# Patient Record
Sex: Female | Born: 1970 | Race: White | Hispanic: No | Marital: Single | Smoking: Former smoker
Health system: Southern US, Community
[De-identification: ages and names within clinical notes are randomized; demographics above are authoritative.]

## PROBLEM LIST (undated history)

## (undated) DIAGNOSIS — J45909 Unspecified asthma, uncomplicated: Secondary | ICD-10-CM

## (undated) DIAGNOSIS — T4145XA Adverse effect of unspecified anesthetic, initial encounter: Secondary | ICD-10-CM

## (undated) DIAGNOSIS — T8859XA Other complications of anesthesia, initial encounter: Secondary | ICD-10-CM

## (undated) DIAGNOSIS — J302 Other seasonal allergic rhinitis: Secondary | ICD-10-CM

## (undated) DIAGNOSIS — W57XXXA Bitten or stung by nonvenomous insect and other nonvenomous arthropods, initial encounter: Secondary | ICD-10-CM

## (undated) DIAGNOSIS — E063 Autoimmune thyroiditis: Secondary | ICD-10-CM

## (undated) DIAGNOSIS — E039 Hypothyroidism, unspecified: Secondary | ICD-10-CM

## (undated) HISTORY — DX: Unspecified asthma, uncomplicated: J45.909

## (undated) HISTORY — DX: Autoimmune thyroiditis: E06.3

## (undated) HISTORY — PX: CHOLECYSTECTOMY: SHX55

## (undated) HISTORY — DX: Hypothyroidism, unspecified: E03.9

## (undated) HISTORY — PX: TONSILLECTOMY: SUR1361

## (undated) HISTORY — DX: Bitten or stung by nonvenomous insect and other nonvenomous arthropods, initial encounter: W57.XXXA

---

## 2012-08-08 ENCOUNTER — Other Ambulatory Visit: Payer: Self-pay | Admitting: Obstetrics & Gynecology

## 2012-08-08 DIAGNOSIS — Z139 Encounter for screening, unspecified: Secondary | ICD-10-CM

## 2012-08-18 ENCOUNTER — Other Ambulatory Visit (HOSPITAL_COMMUNITY)
Admission: RE | Admit: 2012-08-18 | Discharge: 2012-08-18 | Disposition: A | Discharge status: Home / Self Care | Payer: BC Managed Care – PPO | Source: Ambulatory Visit | Attending: Obstetrics & Gynecology | Admitting: Obstetrics & Gynecology

## 2012-08-18 ENCOUNTER — Other Ambulatory Visit: Payer: Self-pay | Admitting: Obstetrics & Gynecology

## 2012-08-18 ENCOUNTER — Ambulatory Visit (HOSPITAL_COMMUNITY)
Admission: RE | Admit: 2012-08-18 | Discharge: 2012-08-18 | Disposition: A | Discharge status: Home / Self Care | Payer: BC Managed Care – PPO | Source: Ambulatory Visit | Attending: Obstetrics & Gynecology | Admitting: Obstetrics & Gynecology

## 2012-08-18 DIAGNOSIS — Z1231 Encounter for screening mammogram for malignant neoplasm of breast: Secondary | ICD-10-CM | POA: Insufficient documentation

## 2012-08-18 DIAGNOSIS — Z01419 Encounter for gynecological examination (general) (routine) without abnormal findings: Secondary | ICD-10-CM | POA: Insufficient documentation

## 2012-08-18 DIAGNOSIS — Z1151 Encounter for screening for human papillomavirus (HPV): Secondary | ICD-10-CM | POA: Insufficient documentation

## 2012-08-18 DIAGNOSIS — Z139 Encounter for screening, unspecified: Secondary | ICD-10-CM

## 2012-08-23 ENCOUNTER — Other Ambulatory Visit: Payer: Self-pay | Admitting: Obstetrics & Gynecology

## 2012-08-23 DIAGNOSIS — R928 Other abnormal and inconclusive findings on diagnostic imaging of breast: Secondary | ICD-10-CM

## 2012-09-14 ENCOUNTER — Ambulatory Visit (HOSPITAL_COMMUNITY)
Admission: RE | Admit: 2012-09-14 | Discharge: 2012-09-14 | Disposition: A | Discharge status: Home / Self Care | Payer: BC Managed Care – PPO | Source: Ambulatory Visit | Attending: Obstetrics & Gynecology | Admitting: Obstetrics & Gynecology

## 2012-09-14 ENCOUNTER — Other Ambulatory Visit: Payer: Self-pay | Admitting: Obstetrics & Gynecology

## 2012-09-14 DIAGNOSIS — R928 Other abnormal and inconclusive findings on diagnostic imaging of breast: Secondary | ICD-10-CM

## 2013-08-16 ENCOUNTER — Other Ambulatory Visit: Payer: Self-pay | Admitting: Obstetrics & Gynecology

## 2013-08-16 DIAGNOSIS — Z139 Encounter for screening, unspecified: Secondary | ICD-10-CM

## 2013-08-23 ENCOUNTER — Other Ambulatory Visit: Payer: Self-pay | Admitting: Obstetrics & Gynecology

## 2013-09-15 ENCOUNTER — Ambulatory Visit (HOSPITAL_COMMUNITY)
Admission: RE | Admit: 2013-09-15 | Discharge: 2013-09-15 | Disposition: A | Discharge status: Home / Self Care | Payer: BC Managed Care – PPO | Source: Ambulatory Visit | Attending: Obstetrics & Gynecology | Admitting: Obstetrics & Gynecology

## 2013-09-15 ENCOUNTER — Other Ambulatory Visit (HOSPITAL_COMMUNITY)
Admission: RE | Admit: 2013-09-15 | Discharge: 2013-09-15 | Disposition: A | Discharge status: Home / Self Care | Payer: BC Managed Care – PPO | Source: Ambulatory Visit | Attending: Obstetrics & Gynecology | Admitting: Obstetrics & Gynecology

## 2013-09-15 ENCOUNTER — Encounter (INDEPENDENT_AMBULATORY_CARE_PROVIDER_SITE_OTHER): Payer: Self-pay

## 2013-09-15 ENCOUNTER — Ambulatory Visit (INDEPENDENT_AMBULATORY_CARE_PROVIDER_SITE_OTHER): Payer: BC Managed Care – PPO | Admitting: Obstetrics & Gynecology

## 2013-09-15 ENCOUNTER — Encounter: Payer: Self-pay | Admitting: Obstetrics & Gynecology

## 2013-09-15 VITALS — BP 110/80 | Ht 64.0 in | Wt 197.0 lb

## 2013-09-15 DIAGNOSIS — Z1231 Encounter for screening mammogram for malignant neoplasm of breast: Secondary | ICD-10-CM | POA: Insufficient documentation

## 2013-09-15 DIAGNOSIS — Z01419 Encounter for gynecological examination (general) (routine) without abnormal findings: Secondary | ICD-10-CM

## 2013-09-15 DIAGNOSIS — Z139 Encounter for screening, unspecified: Secondary | ICD-10-CM

## 2013-09-15 NOTE — Progress Notes (Signed)
Patient ID: Kimberly Vance, female   DOB: [birth year], 42 y.o.   MRN: [id number] Subjective:     Kimberly Vance is a 42 y.o. female here for a routine exam.  No LMP recorded. Patient is not currently having periods (Reason: Perimenopausal). No obstetric history on file. Birth Control Method:  none Menstrual Calendar(currently): none in 1 year  Current complaints: none.   Current acute medical issues:  none   Recent Gynecologic History No LMP recorded. Patient is not currently having periods (Reason: Perimenopausal). Last Pap: 2013,  normal Last mammogram: today,  pending  History reviewed. No pertinent past medical history.  History reviewed. No pertinent past surgical history.  OB History   Grav Para Term Preterm Abortions TAB SAB Ect Mult Living                  History   Social History  . Marital Status: Single    Spouse Name: N/A    Number of Children: N/A  . Years of Education: N/A   Social History Main Topics  . Smoking status: Current Some Day Smoker  . Smokeless tobacco: None  . Alcohol Use: None  . Drug Use: None  . Sexual Activity: None   Other Topics Concern  . None   Social History Narrative  . None    Family History  Problem Relation Age of Onset  . Diabetes Mother   . Diabetes Father   . Diabetes Maternal Grandmother   . Diabetes Paternal Grandmother      Review of Systems  Review of Systems  Constitutional: Negative for fever, chills, weight loss, malaise/fatigue and diaphoresis.  HENT: Negative for hearing loss, ear pain, nosebleeds, congestion, sore throat, neck pain, tinnitus and ear discharge.   Eyes: Negative for blurred vision, double vision, photophobia, pain, discharge and redness.  Respiratory: Negative for cough, hemoptysis, sputum production, shortness of breath, wheezing and stridor.   Cardiovascular: Negative for chest pain, palpitations, orthopnea, claudication, leg swelling and PND.  Gastrointestinal: negative for abdominal pain.  Negative for heartburn, nausea, vomiting, diarrhea, constipation, blood in stool and melena.  Genitourinary: Negative for dysuria, urgency, frequency, hematuria and flank pain.  Musculoskeletal: Negative for myalgias, back pain, joint pain and falls.  Skin: Negative for itching and rash.  Neurological: Negative for dizziness, tingling, tremors, sensory change, speech change, focal weakness, seizures, loss of consciousness, weakness and headaches.  Endo/Heme/Allergies: Negative for environmental allergies and polydipsia. Does not bruise/bleed easily.  Psychiatric/Behavioral: Negative for depression, suicidal ideas, hallucinations, memory loss and substance abuse. The patient is not nervous/anxious and does not have insomnia.        Objective:    Physical Exam  Vitals reviewed. Constitutional: She is oriented to person, place, and time. She appears well-developed and well-nourished.  HENT:  Head: Normocephalic and atraumatic.        Right Ear: External ear normal.  Left Ear: External ear normal.  Nose: Nose normal.  Mouth/Throat: Oropharynx is clear and moist.  Eyes: Conjunctivae and EOM are normal. Pupils are equal, round, and reactive to light. Right eye exhibits no discharge. Left eye exhibits no discharge. No scleral icterus.  Neck: Normal range of motion. Neck supple. No tracheal deviation present. No thyromegaly present.  Cardiovascular: Normal rate, regular rhythm, normal heart sounds and intact distal pulses.  Exam reveals no gallop and no friction rub.   No murmur heard. Respiratory: Effort normal and breath sounds normal. No respiratory distress. She has no wheezes. She has no rales. She exhibits no  tenderness.  GI: Soft. Bowel sounds are normal. She exhibits no distension and no mass. There is no tenderness. There is no rebound and no guarding.  Genitourinary:  Breasts no masses skin changes or nipple changes bilaterally      Vulva is normal without lesions Vagina is pink moist  without discharge Cervix normal in appearance and pap is done Uterus is normal size shape and contour Adnexa is negative with normal sized ovaries    Musculoskeletal: Normal range of motion. She exhibits no edema and no tenderness.  Neurological: She is alert and oriented to person, place, and time. She has normal reflexes. She displays normal reflexes. No cranial nerve deficit. She exhibits normal muscle tone. Coordination normal.  Skin: Skin is warm and dry. No rash noted. No erythema. No pallor.  Psychiatric: She has a normal mood and affect. Her behavior is normal. Judgment and thought content normal.       Assessment:    Healthy female exam.   Perimenopausal/post menopausal Plan:    Follow up in: 1 year.

## 2013-12-04 ENCOUNTER — Ambulatory Visit (INDEPENDENT_AMBULATORY_CARE_PROVIDER_SITE_OTHER): Payer: BC Managed Care – PPO | Admitting: Obstetrics & Gynecology

## 2013-12-04 ENCOUNTER — Encounter: Payer: Self-pay | Admitting: Obstetrics & Gynecology

## 2013-12-04 VITALS — BP 100/70 | Ht 65.0 in | Wt 206.0 lb

## 2013-12-04 DIAGNOSIS — N950 Postmenopausal bleeding: Secondary | ICD-10-CM

## 2013-12-04 LAB — POCT HEMOGLOBIN: Hemoglobin: 13.8 g/dL (ref 12.2–16.2)

## 2013-12-04 MED ORDER — MEGESTROL ACETATE 40 MG PO TABS: ORAL_TABLET | ORAL | Status: DC

## 2013-12-04 NOTE — Addendum Note (Signed)
Addended by: Criss AlvinePULLIAM, Rickie Gutierres G on: 12/04/2013 03:15 PM   Modules accepted: Orders

## 2013-12-04 NOTE — Progress Notes (Signed)
Patient ID: Kimberly BeardsShannon Vance, female   DOB: 09/15/1971, 43 y.o.   MRN: [id number] Pt presnets complaining of bleeding since yestaerday am, none in the previous 18 months or so Heavy some clots, no cramping  Exam No polyps or other findings, heavy bleeding  Uterus normal size shape contour adnexa negative  Megestrol to manage bleeding Sonogram 2 weeks to evaluate endometrium

## 2013-12-19 ENCOUNTER — Ambulatory Visit (INDEPENDENT_AMBULATORY_CARE_PROVIDER_SITE_OTHER): Payer: BC Managed Care – PPO

## 2013-12-19 ENCOUNTER — Encounter: Payer: Self-pay | Admitting: Obstetrics & Gynecology

## 2013-12-19 ENCOUNTER — Encounter (INDEPENDENT_AMBULATORY_CARE_PROVIDER_SITE_OTHER): Payer: Self-pay

## 2013-12-19 ENCOUNTER — Ambulatory Visit (INDEPENDENT_AMBULATORY_CARE_PROVIDER_SITE_OTHER): Payer: BC Managed Care – PPO | Admitting: Obstetrics & Gynecology

## 2013-12-19 VITALS — BP 110/80 | Wt 208.0 lb

## 2013-12-19 DIAGNOSIS — N951 Menopausal and female climacteric states: Secondary | ICD-10-CM

## 2013-12-19 DIAGNOSIS — N950 Postmenopausal bleeding: Secondary | ICD-10-CM

## 2013-12-19 MED ORDER — CONJ ESTROGENS-BAZEDOXIFENE 0.45-20 MG PO TABS: 1.0000 | ORAL_TABLET | Freq: Every day | ORAL | Status: DC

## 2013-12-19 NOTE — Progress Notes (Signed)
Patient ID: Kimberly Vance, female   DOB: 07/05/1971, 43 y.o.   MRN: [id number] [place] Transvaginal Non-ob  12/19/2013   GYNECOLOGIC SONOGRAM   Kimberly Vance is a 43 y.o. No obstetric history on file.  for a pelvic  sonogram for post menopausal bleeding.  Uterus                      7.2 x 4.9 x 3.2 cm, no myometrial masses noted    Endometrium          5.1 mm, symmetrical,   Right ovary             2.1 x 0.9 x 0.7 cm,   Left ovary                2.4 x 1.7 x 1.4 cm,   No free fluid or adnexal masses noted   Technician Comments:  Anteverted uterus Endom-5.441mm symmetrical bilateral adnexa/ovaries appears  WNL   Chari ManningMcBride, Tasha 12/19/2013 2:10 PM  Clinical Impression and recommendations:  I have reviewed the sonogram results above, combined with the patient's  current clinical course, below are my impressions and any appropriate  recommendations for management based on the sonographic findings.  Normal gynecologic sonogram without any pathology to explain bleeding  EURE,LUTHER H 12/19/2013 2:43 PM     Sonogram normal  Options discussed  I have recommended Duavee for ERT + endometrial suppression and rediscuss at around age 43  Samples and Rx e prescribed  History reviewed. No pertinent past medical history.  Past Surgical History  Procedure Laterality Date  . Cholecystectomy    . Tonsillectomy      OB History   Grav Para Term Preterm Abortions TAB SAB Ect Mult Living                  Allergies  Allergen Reactions  . Erythromycin Itching    History   Social History  . Marital Status: Single    Spouse Name: N/A    Number of Children: N/A  . Years of Education: N/A   Social History Main Topics  . Smoking status: Former Games developermoker  . Smokeless tobacco: None  . Alcohol Use: None  . Drug Use: None  . Sexual Activity: None   Other Topics Concern  . None   Social History Narrative  . None    Family History  Problem Relation Age of Onset  . Diabetes Mother   . Hypertension Mother   .  Diabetes Father   . Hypertension Father   . Diabetes Maternal Grandmother   . Diabetes Paternal Grandmother

## 2014-08-31 ENCOUNTER — Other Ambulatory Visit: Payer: Self-pay | Admitting: Obstetrics & Gynecology

## 2014-08-31 DIAGNOSIS — Z1231 Encounter for screening mammogram for malignant neoplasm of breast: Secondary | ICD-10-CM

## 2014-09-18 ENCOUNTER — Other Ambulatory Visit: Payer: BC Managed Care – PPO | Admitting: Obstetrics & Gynecology

## 2014-09-19 ENCOUNTER — Other Ambulatory Visit: Payer: BC Managed Care – PPO | Admitting: Obstetrics & Gynecology

## 2014-09-19 ENCOUNTER — Ambulatory Visit (HOSPITAL_COMMUNITY): Payer: BC Managed Care – PPO

## 2014-09-24 ENCOUNTER — Ambulatory Visit (HOSPITAL_COMMUNITY): Payer: BC Managed Care – PPO

## 2014-10-01 ENCOUNTER — Other Ambulatory Visit: Payer: BC Managed Care – PPO | Admitting: Obstetrics & Gynecology

## 2014-10-01 ENCOUNTER — Ambulatory Visit (HOSPITAL_COMMUNITY)
Admission: RE | Admit: 2014-10-01 | Discharge: 2014-10-01 | Disposition: A | Discharge status: Home / Self Care | Payer: BC Managed Care – PPO | Source: Ambulatory Visit | Attending: Obstetrics & Gynecology | Admitting: Obstetrics & Gynecology

## 2014-10-01 DIAGNOSIS — Z1231 Encounter for screening mammogram for malignant neoplasm of breast: Secondary | ICD-10-CM | POA: Diagnosis present

## 2014-10-09 ENCOUNTER — Encounter: Payer: Self-pay | Admitting: Obstetrics & Gynecology

## 2014-10-09 ENCOUNTER — Ambulatory Visit (INDEPENDENT_AMBULATORY_CARE_PROVIDER_SITE_OTHER): Payer: BC Managed Care – PPO | Admitting: Obstetrics & Gynecology

## 2014-10-09 ENCOUNTER — Other Ambulatory Visit (HOSPITAL_COMMUNITY)
Admission: RE | Admit: 2014-10-09 | Discharge: 2014-10-09 | Disposition: A | Discharge status: Home / Self Care | Payer: BC Managed Care – PPO | Source: Ambulatory Visit | Attending: Obstetrics & Gynecology | Admitting: Obstetrics & Gynecology

## 2014-10-09 VITALS — BP 108/70 | Ht 66.0 in | Wt 204.0 lb

## 2014-10-09 DIAGNOSIS — E538 Deficiency of other specified B group vitamins: Secondary | ICD-10-CM

## 2014-10-09 DIAGNOSIS — E038 Other specified hypothyroidism: Secondary | ICD-10-CM

## 2014-10-09 DIAGNOSIS — Z01419 Encounter for gynecological examination (general) (routine) without abnormal findings: Secondary | ICD-10-CM | POA: Diagnosis present

## 2014-10-09 DIAGNOSIS — R7989 Other specified abnormal findings of blood chemistry: Secondary | ICD-10-CM

## 2014-10-09 NOTE — Progress Notes (Signed)
Patient ID: Kimberly Vance, female   DOB: 07/30/1971, 43 y.o.   MRN: [id number] Subjective:     Kimberly Vance is a 43 y.o. female here for a routine exam.  No LMP recorded. Patient is postmenopausal. No obstetric history on file. Birth Control Method:  Post menopausal Menstrual Calendar(currently): amenorrheic  Current complaints: unusual tinglin numbness in extrmeities, no significant visual disturbances.   Current acute medical issues:  none   Recent Gynecologic History No LMP recorded. Patient is postmenopausal. Last Pap: 2014,  normal Last mammogram: 2014,  normal  History reviewed. No pertinent past medical history.  Past Surgical History  Procedure Laterality Date  . Cholecystectomy    . Tonsillectomy      OB History    No data available      History   Social History  . Marital Status: Single    Spouse Name: N/A    Number of Children: N/A  . Years of Education: N/A   Social History Main Topics  . Smoking status: Former Games developermoker  . Smokeless tobacco: None  . Alcohol Use: None  . Drug Use: None  . Sexual Activity: None   Other Topics Concern  . None   Social History Narrative    Family History  Problem Relation Age of Onset  . Diabetes Mother   . Hypertension Mother   . Diabetes Father   . Hypertension Father   . Diabetes Maternal Grandmother   . Diabetes Paternal Grandmother     Current outpatient prescriptions: Conj Estrogens-Bazedoxifene (DUAVEE) 0.45-20 MG TABS, Take 1 tablet by mouth daily. (Patient not taking: Reported on 10/09/2014), Disp: 30 tablet, Rfl: 11;  megestrol (MEGACE) 40 MG tablet, 3 tablets a day for 5 days 2 tablets a day for 5 days then 1 tablet a day (Patient not taking: Reported on 10/09/2014), Disp: 45 tablet, Rfl: 1  Review of Systems  Review of Systems  Constitutional: Negative for fever, chills, weight loss, malaise/fatigue and diaphoresis.  HENT: Negative for hearing loss, ear pain, nosebleeds, congestion, sore throat, neck  pain, tinnitus and ear discharge.   Eyes: Negative for blurred vision, double vision, photophobia, pain, discharge and redness.  Respiratory: Negative for cough, hemoptysis, sputum production, shortness of breath, wheezing and stridor.   Cardiovascular: Negative for chest pain, palpitations, orthopnea, claudication, leg swelling and PND.  Gastrointestinal: negative for abdominal pain. Negative for heartburn, nausea, vomiting, diarrhea, constipation, blood in stool and melena.  Genitourinary: Negative for dysuria, urgency, frequency, hematuria and flank pain.  Musculoskeletal: Negative for myalgias, back pain, joint pain and falls.  Skin: Negative for itching and rash.  Neurological: Negative for dizziness, tingling, tremors, sensory change, speech change, focal weakness, seizures, loss of consciousness, weakness and headaches.  Endo/Heme/Allergies: Negative for environmental allergies and polydipsia. Does not bruise/bleed easily.  Psychiatric/Behavioral: Negative for depression, suicidal ideas, hallucinations, memory loss and substance abuse. The patient is not nervous/anxious and does not have insomnia.        Objective:  Blood pressure 108/70, height 5\' 6"  (1.676 m), weight 204 lb (92.534 kg).   Physical Exam  Vitals reviewed. Constitutional: She is oriented to person, place, and time. She appears well-developed and well-nourished.  HENT:  Head: Normocephalic and atraumatic.        Right Ear: External ear normal.  Left Ear: External ear normal.  Nose: Nose normal.  Mouth/Throat: Oropharynx is clear and moist.  Eyes: Conjunctivae and EOM are normal. Pupils are equal, round, and reactive to light. Right eye exhibits no discharge. Left  eye exhibits no discharge. No scleral icterus.  Neck: Normal range of motion. Neck supple. No tracheal deviation present. No thyromegaly present.  Cardiovascular: Normal rate, regular rhythm, normal heart sounds and intact distal pulses.  Exam reveals no  gallop and no friction rub.   No murmur heard. Respiratory: Effort normal and breath sounds normal. No respiratory distress. She has no wheezes. She has no rales. She exhibits no tenderness.  GI: Soft. Bowel sounds are normal. She exhibits no distension and no mass. There is no tenderness. There is no rebound and no guarding.  Genitourinary:  Breasts no masses skin changes or nipple changes bilaterally      Vulva is normal without lesions Vagina is pink moist without discharge Cervix normal in appearance and pap is done Uterus is normal size shape and contour Adnexa is negative with normal sized ovaries   Musculoskeletal: Normal range of motion. She exhibits no edema and no tenderness.  Neurological: She is alert and oriented to person, place, and time. She has normal reflexes. She displays normal reflexes. No cranial nerve deficit. She exhibits normal muscle tone. Coordination normal.  Skin: Skin is warm and dry. No rash noted. No erythema. No pallor.  Psychiatric: She has a normal mood and affect. Her behavior is normal. Judgment and thought content normal.       Assessment:    Healthy female exam.   unusual neurological symptoms Plan:    Follow up in: 1 year. check TSH B12, refer to Dr Jerre [place] neurologist for evaluation

## 2014-10-09 NOTE — Addendum Note (Signed)
Addended by: Criss AlvinePULLIAM, Mellissa Conley G on: 10/09/2014 03:37 PM   Modules accepted: Orders

## 2014-10-10 ENCOUNTER — Telehealth: Payer: Self-pay | Admitting: *Deleted

## 2014-10-10 DIAGNOSIS — R200 Anesthesia of skin: Secondary | ICD-10-CM

## 2014-10-10 DIAGNOSIS — R202 Paresthesia of skin: Principal | ICD-10-CM

## 2014-10-10 LAB — VITAMIN B12: VITAMIN B 12: 320 pg/mL (ref 211–911)

## 2014-10-10 LAB — TSH: TSH: 2.885 u[IU]/mL (ref 0.350–4.500)

## 2014-10-10 NOTE — Telephone Encounter (Signed)
Spoke with pt. Pt states she wants to be referred to [facility], [phone]. I let pt know I would sent this to you. JSY

## 2014-10-11 LAB — CYTOLOGY - PAP

## 2014-10-11 NOTE — Telephone Encounter (Signed)
Please refer

## 2014-10-11 NOTE — Telephone Encounter (Signed)
Referral for [facility] done in Epic.

## 2014-11-22 ENCOUNTER — Encounter: Payer: Self-pay | Admitting: Neurology

## 2014-11-22 ENCOUNTER — Ambulatory Visit (INDEPENDENT_AMBULATORY_CARE_PROVIDER_SITE_OTHER): Payer: BLUE CROSS/BLUE SHIELD | Admitting: Neurology

## 2014-11-22 VITALS — BP 110/80 | HR 73 | Ht 66.0 in | Wt 210.2 lb

## 2014-11-22 DIAGNOSIS — G43109 Migraine with aura, not intractable, without status migrainosus: Secondary | ICD-10-CM

## 2014-11-22 DIAGNOSIS — R209 Unspecified disturbances of skin sensation: Secondary | ICD-10-CM

## 2014-11-22 DIAGNOSIS — R202 Paresthesia of skin: Secondary | ICD-10-CM

## 2014-11-22 DIAGNOSIS — M79605 Pain in left leg: Secondary | ICD-10-CM

## 2014-11-22 NOTE — Patient Instructions (Signed)
1.  Check blood work 2.  MRI brain wwo contrast 3.  EMG of the right arm and leg 4.  Return to clinic in 133-months

## 2014-11-22 NOTE — Progress Notes (Signed)
[facility] Clinic Note - Initial Visit   Date: 11/22/2014   Kimberly Vance MRN: [id number] DOB: [birth year]   Dear Dr. Elonda Husky:  Thank you for your kind referral of Kimberly Vance for consultation of disturbance of skin sensation. Although her history is well known to you, please allow [place] to reiterate it for the purpose of our medical record. The patient was accompanied to the clinic by self.   History of Present Illness: Kimberly Vance is a 44 y.o. right-handed Caucasian female with no prior medical history presenting for evaluation of numbness/tinging of the arms.    Starting early 2010, she developed intermittent spells of numbness of the entire arms which was initially sporadic and more recently has become more frequent and it takes longer for recovery. Symptoms wake her up from sleeping and she tried to reposition, but it does not help.  She reports having numbness from her elbow down into her hands and heavy sensation over the upper arm and occasionally had a spell where her tongue went numb.    She also complains of right leg pain which is triggered by pressure over her right lateral knee, such as when squatting or hitting it against something.  She feels that her feet are always numb.  No back or neck pain.   She endorses weakness but states that she has always been a "clutz" her entire life and always runs into things, falls, and drops things.  She never played sports growing up because she was always accident prone.  No family history of neuropathy.   She reports having migraines since her 43s.  Pain usually started in the front of her head and described as throbbing.  She endorses photophobia, phonophobia, and nausea.  She tries to sleep it off and takes 2-3 advil which helps.  She gets them about 1-2 times per month.  Her mother had migraines when she was younger.   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  Additionally, she works night shift in [employer].    Out-side paper records, electronic medical record, and images have been reviewed where available and summarized as:  Lab Results  Component Value Date   TSH 2.885 10/09/2014   Lab Results  Component Value Date   VITAMINB12 320 10/09/2014   Past medical history:  None  Past Surgical History  Procedure Laterality Date  . Cholecystectomy    . Tonsillectomy       Medications:  None   Allergies  Allergen Reactions  . Erythromycin Itching   Family History  Problem Relation Age of Onset  . Diabetes Mother   . Hypertension Mother   . Diabetes Father   . Hypertension Father   . Diabetes Maternal Grandmother   . Diabetes Paternal Grandmother   . Healthy Son     Social History: History   Social History  . Marital Status: Single    Spouse Name: N/A  . Number of Children: N/A  . Years of Education: N/A   Occupational History  . Not on file.   Social History Main Topics  . Smoking status: Light Tobacco Smoker -- 1 years  . Smokeless tobacco: Not on file     Comment: 1 pack every 2 weeks  . Alcohol Use: 0.0 oz/week    0 Standard drinks or equivalent per week     Comment: 1-2x per week/month  . Drug Use: No  .  Sexual Activity: Not on file   Other Topics Concern  . Not on file   Social History Narrative   Lives with son in one story home.     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     Also works at [employer].      Review of Systems:  CONSTITUTIONAL: No fevers, chills, night sweats, or weight loss.   EYES: No visual changes or eye pain ENT: No hearing changes.  No history of nose bleeds.   RESPIRATORY: No cough, wheezing and shortness of breath.   CARDIOVASCULAR: Negative for chest pain, and palpitations.   GI: Negative for abdominal discomfort, blood in stools or  black stools.  No recent change in bowel habits.   GU:  No history of incontinence.   MUSCLOSKELETAL: No history of joint pain or swelling.  No myalgias.   SKIN: Negative for lesions, rash, and itching.   HEMATOLOGY/ONCOLOGY: Negative for prolonged bleeding, bruising easily, and swollen nodes.  No history of cancer.   ENDOCRINE: Negative for cold or heat intolerance, polydipsia or goiter.   PSYCH:  No depression or anxiety symptoms.   NEURO: As Above.   Vital Signs:  BP 110/80 mmHg  Pulse 73  Ht '5\' 6"'  (1.676 m)  Wt 210 lb 3 oz (95.34 kg)  BMI 33.94 kg/m2  SpO2 96%   General Medical Exam:   General:  Well appearing, comfortable.   Eyes/ENT: see cranial nerve examination.   Neck: No masses appreciated.  Full range of motion without tenderness.  No carotid bruits. Respiratory:  Clear to auscultation, good air entry bilaterally.   Cardiac:  Regular rate and rhythm, no murmur.   Extremities:  No deformities, edema, or skin discoloration.  Skin:  No rashes or lesions.  Neurological Exam: MENTAL STATUS including orientation to time, place, person, recent and remote memory, attention span and concentration, language, and fund of knowledge is normal.  Speech is not dysarthric.  CRANIAL NERVES: II:  No visual field defects.  Unremarkable fundi.   III-IV-VI: Pupils equal round and reactive to light.  Normal conjugate, extra-ocular eye movements in all directions of gaze.  No nystagmus.  No ptosis.   V:  Normal facial sensation.    VII:  Normal facial symmetry and movements.  No pathologic facial reflexes.  VIII:  Normal hearing and vestibular function.   IX-X:  Normal palatal movement.   XI:  Normal shoulder shrug and head rotation.   XII:  Normal tongue strength and range of motion, no deviation or fasciculation.  MOTOR:  No atrophy, fasciculations or abnormal movements.  No pronator drift.  Tone is normal.    Right Upper Extremity:    Left Upper Extremity:    Deltoid  5/5   Deltoid   5/5   Biceps  5/5   Biceps  5/5   Triceps  5/5   Triceps  5/5   Wrist extensors  5/5   Wrist extensors  5/5   Wrist flexors  5/5   Wrist flexors  5/5   Finger extensors  5/5   Finger extensors  5/5   Finger flexors  5/5   Finger flexors  5/5   Dorsal interossei  5/5   Dorsal interossei  5/5   Abductor pollicis  5/5   Abductor pollicis  5/5   Tone (Ashworth scale)  0  Tone (Ashworth scale)  0   Right Lower Extremity:  Left Lower Extremity:    Hip flexors  5/5   Hip flexors  5/5   Hip extensors  5/5   Hip extensors  5/5   Knee flexors  5/5   Knee flexors  5/5   Knee extensors  5/5   Knee extensors  5/5   Dorsiflexors  5/5   Dorsiflexors  5/5   Plantarflexors  5/5   Plantarflexors  5/5   Toe extensors  5/5   Toe extensors  5/5   Toe flexors  5/5   Toe flexors  5/5   Tone (Ashworth scale)  0  Tone (Ashworth scale)  0   MSRs:  Right                                                                 Left brachioradialis 2+  brachioradialis 2+  biceps 2+  biceps 2+  triceps 2+  triceps 2+  patellar 2+  patellar 2+  ankle jerk 2+  ankle jerk 2+  Hoffman no  Hoffman no  plantar response down  plantar response down   SENSORY:  Diminished pin prick throughout (face, arms, legs, feet), reduced temperature and vibration at the feet bilaterally. Romberg's sign absent.   COORDINATION/GAIT: Normal finger-to- nose-finger and heel-to-shin.  Intact rapid alternating movements bilaterally.  Able to rise from a chair without using arms.  Gait narrow based and stable. Tandem and stressed gait intact.    IMPRESSION: Ms. Biddle is a 44 year-old female presenting for evaluation of generalized paresthesias.  Her exam shows sensory deficits following non-anatomical distribution.  Motor strength and reflexes are intact.  Based on her history and exam, the possibilities are several including demyelinating disease (given face involvement and patchy sensory changes), migraine equivalent syndrome, manifestation  of stress, and less likely neuropathy.  Because she is young and otherwise healthy, demyelinating disease as well as other autiommune disease needs to be evaluated for.  If her work-up returns normal, empiric treatment for migraine equivalent syndrome and/or oral vitamin B12 supplements may be indicated.   PLAN/RECOMMENDATIONS:  1.  MRI brain wwo contrast 2.  EMG of the right arm and leg 3.  Check 2hr glucose tolerance test, ESR, ANA, ENA, SPEP/UPEP with IFE, copper, vitamin D 4.  Return to clinic in 3 months.   The duration of this appointment visit was 45 minutes of face-to-face time with the patient.  Greater than 50% of this time was spent in counseling, explanation of diagnosis, planning of further management, and coordination of care.   Thank you for allowing me to participate in patient's care.  If I can answer any additional questions, I would be pleased to do so.    Sincerely,    Dinora Hemm K. Posey Pronto, DO

## 2014-11-23 LAB — ENA 9 PANEL
Centromere Ab Screen: <1
ENA SM Ab Ser-aCnc: <1
Jo-1 Antibody, IgG: <1
Ribosomal P Protein Ab: <1
SM/RNP: <1
SSA (Ro) (ENA) Antibody, IgG: <1
SSB (La) (ENA) Antibody, IgG: <1
Scleroderma (Scl-70) (ENA) Antibody, IgG: <1

## 2014-11-23 LAB — ANA: ANA: NEGATIVE

## 2014-11-23 LAB — VITAMIN D 25 HYDROXY (VIT D DEFICIENCY, FRACTURES): VIT D 25 HYDROXY: 26 ng/mL — AB (ref 30–100)

## 2014-11-23 LAB — SEDIMENTATION RATE: SED RATE: 5 mm/h (ref 0–20)

## 2014-11-24 LAB — COPPER, SERUM: Copper: 100 ug/dL (ref 70–175)

## 2014-11-26 ENCOUNTER — Inpatient Hospital Stay: Admission: RE | Admit: 2014-11-26 | Payer: BLUE CROSS/BLUE SHIELD | Source: Ambulatory Visit

## 2014-11-26 LAB — UIFE/LIGHT CHAINS/TP QN, 24-HR UR
ALBUMIN, U: DETECTED
ALPHA 2 UR: DETECTED — AB
Alpha 1, Urine: DETECTED — AB
Beta, Urine: DETECTED — AB
Gamma Globulin, Urine: DETECTED — AB

## 2014-11-26 LAB — SPEP & IFE WITH QIG
Albumin ELP: 62 % (ref 55.8–66.1)
Alpha-1-Globulin: 3.5 % (ref 2.9–4.9)
Alpha-2-Globulin: 9.9 % (ref 7.1–11.8)
BETA GLOBULIN: 6.3 % (ref 4.7–7.2)
Beta 2: 5.7 % (ref 3.2–6.5)
Gamma Globulin: 12.6 % (ref 11.1–18.8)
IGA: 270 mg/dL (ref 69–380)
IGG (IMMUNOGLOBIN G), SERUM: 1040 mg/dL (ref 690–1700)
IGM, SERUM: 106 mg/dL (ref 52–322)
Total Protein, Serum Electrophoresis: 7.2 g/dL (ref 6.0–8.3)

## 2014-11-30 ENCOUNTER — Other Ambulatory Visit: Payer: BLUE CROSS/BLUE SHIELD

## 2014-11-30 DIAGNOSIS — M79605 Pain in left leg: Secondary | ICD-10-CM

## 2014-11-30 DIAGNOSIS — R209 Unspecified disturbances of skin sensation: Secondary | ICD-10-CM

## 2014-11-30 DIAGNOSIS — G43109 Migraine with aura, not intractable, without status migrainosus: Secondary | ICD-10-CM

## 2014-11-30 DIAGNOSIS — R202 Paresthesia of skin: Secondary | ICD-10-CM

## 2014-11-30 LAB — GLUCOSE TOLERANCE, 2 HOURS
Glucose, 1 Hour GTT: 189 mg/dL
Glucose, 2 hour: 121 mg/dL
Glucose, Fasting: 94 mg/dL (ref 70–99)

## 2014-12-04 ENCOUNTER — Ambulatory Visit
Admission: RE | Admit: 2014-12-04 | Discharge: 2014-12-04 | Disposition: A | Discharge status: Home / Self Care | Payer: BLUE CROSS/BLUE SHIELD | Source: Ambulatory Visit | Attending: Neurology | Admitting: Neurology

## 2014-12-04 DIAGNOSIS — R202 Paresthesia of skin: Secondary | ICD-10-CM

## 2014-12-04 DIAGNOSIS — M79605 Pain in left leg: Secondary | ICD-10-CM

## 2014-12-04 DIAGNOSIS — R209 Unspecified disturbances of skin sensation: Secondary | ICD-10-CM

## 2014-12-04 DIAGNOSIS — G43109 Migraine with aura, not intractable, without status migrainosus: Secondary | ICD-10-CM

## 2014-12-04 MED ORDER — GADOBENATE DIMEGLUMINE 529 MG/ML IV SOLN
15.0000 mL | Freq: Once | INTRAVENOUS | Status: AC | PRN
  Administered 2014-12-04: 19 mL via INTRAVENOUS

## 2014-12-17 ENCOUNTER — Ambulatory Visit (INDEPENDENT_AMBULATORY_CARE_PROVIDER_SITE_OTHER): Payer: BLUE CROSS/BLUE SHIELD | Admitting: Neurology

## 2014-12-17 DIAGNOSIS — M79605 Pain in left leg: Secondary | ICD-10-CM

## 2014-12-17 DIAGNOSIS — R209 Unspecified disturbances of skin sensation: Secondary | ICD-10-CM

## 2014-12-17 DIAGNOSIS — R202 Paresthesia of skin: Secondary | ICD-10-CM

## 2014-12-17 DIAGNOSIS — G43109 Migraine with aura, not intractable, without status migrainosus: Secondary | ICD-10-CM

## 2014-12-17 NOTE — Procedures (Signed)
[facility]  [address] [place], [address]  [place], [place] [address] Tel: [phone] Fax:  [phone] Test Date:  12/17/2014  Patient: Kimberly Vance DOB: 12-Dec-1970 Physician: Nita Sickle, DO  Sex: Female Height:  Ref Phys: Nita Sickle  ID#: [id number] Temp: 35.0C Technician: Ala Bent R. NCS T.   Patient Complaints: Patient is a 44 year old female here for evaluation of intermittent right arm and right leg paresthesias and weakness.  NCV & EMG Findings: Extensive electrodiagnostic testing of the right upper extremity and right lower extremity shows:  1. Right median sensory nerve shows prolonged latency with borderline amplitude. Right radial and ulnar sensory responses are within normal limits.  2. Right median and ulnar motor responses are within normal limits.  3. Right sural and superficial peroneal sensory responses are within normal limits.  4. Right tibial and peroneal motor responses are within normal limits.   Impression: 1. Right median neuropathy at or distal to the wrist, consistent with the clinical diagnosis of carpal tunnel syndrome; mild in degree electrically. 2. There is no evidence of a generalized sensorimotor polyneuropathy, myopathy, or cervical/lumbosacral radiculopathy affecting the right side.   ___________________________ Nita Sickle, DO    Nerve Conduction Studies Anti Sensory Summary Table   Site NR Peak (ms) Norm Peak (ms) P-T Amp (V) Norm P-T Amp  Right Median Anti Sensory (2nd Digit)  35C  Wrist    3.7 <3.4 20.1 >20  Right Radial Anti Sensory (Base 1st Digit)  35C  Wrist    2.0 <2.7 42.2 >18  Right Sup Peroneal Anti Sensory (Ant Lat Mall)  35C  12 cm    2.3 <4.5 13.7 >5  Right Sural Anti Sensory (Lat Mall)  35C  Calf    2.7 <4.5 11.9 >5  Right Ulnar Anti Sensory (5th Digit)  35C  Wrist    2.5 <3.1 26.1 >12   Motor Summary Table   Site NR Onset (ms) Norm Onset (ms) O-P Amp (mV) Norm O-P Amp Site1 Site2 Delta-0  (ms) Dist (cm) Vel (m/s) Norm Vel (m/s)  Right Median Motor (Abd Poll Brev)  35C  Wrist    3.7 <3.9 12.3 >6 Elbow Wrist 4.1 26.0 63 >50  Elbow    7.8  10.7         Right Peroneal Motor (Ext Dig Brev)  35C  Ankle    3.3 <5.5 3.5 >3 B Fib Ankle 7.2 33.0 46 >40  B Fib    10.5  3.4  Poplt B Fib 1.6 8.0 50 >40  Poplt    12.1  3.4         Right Peroneal TA Motor (Tib Ant)  35C  Fib Head    2.0 <4.0 5.3 >4 Poplit Fib Head 1.4 8.5 61 >40  Poplit    3.4  5.0         Right Tibial Motor (Abd Hall Brev)  35C  Ankle    3.8 <6.0 13.9 >8 Knee Ankle 6.6 39.0 59 >40  Knee    10.4  10.3         Right Ulnar Motor (Abd Dig Minimi)  35C  Wrist    2.1 <3.1 11.5 >7 B Elbow Wrist 3.5 23.0 66 >50  B Elbow    5.6  11.0  A Elbow B Elbow 1.7 10.0 59 >50  A Elbow    7.3  10.7          EMG   Side Muscle Ins  Act Fibs Psw Fasc Number Recrt Dur Dur. Amp Amp. Poly Poly. Comment  Right AntTibialis Nml Nml Nml Nml Nml Nml Nml Nml Nml Nml Nml Nml N/A  Right Gastroc Nml Nml Nml Nml Nml Nml Nml Nml Nml Nml Nml Nml N/A  Right RectFemoris Nml Nml Nml Nml Nml Nml Nml Nml Nml Nml Nml Nml N/A  Right BicepsFemS Nml Nml Nml Nml Nml Nml Nml Nml Nml Nml Nml Nml N/A  Right GluteusMed Nml Nml Nml Nml Nml Nml Nml Nml Nml Nml Nml Nml N/A  Right 1stDorInt Nml Nml Nml Nml Nml Nml Nml Nml Nml Nml Nml Nml N/A  Right Abd Poll Brev Nml Nml Nml Nml Nml Nml Nml Nml Nml Nml Nml Nml N/A  Right PronatorTeres Nml Nml Nml Nml Nml Nml Nml Nml Nml Nml Nml Nml N/A  Right Biceps Nml Nml Nml Nml Nml Nml Nml Nml Nml Nml Nml Nml N/A  Right Triceps Nml Nml Nml Nml Nml Nml Nml Nml Nml Nml Nml Nml N/A  Right Deltoid Nml Nml Nml Nml Nml Nml Nml Nml Nml Nml Nml Nml N/A  Right Ext Indicis Nml Nml Nml Nml Nml Nml Nml Nml Nml Nml Nml Nml N/A  Right Flex Dig Long Nml Nml Nml Nml Nml Nml Nml Nml Nml Nml Nml Nml N/A      Waveforms:

## 2015-01-03 ENCOUNTER — Encounter: Payer: BLUE CROSS/BLUE SHIELD | Admitting: Neurology

## 2015-02-05 ENCOUNTER — Encounter: Payer: Self-pay | Admitting: Neurology

## 2015-02-22 ENCOUNTER — Ambulatory Visit: Payer: BLUE CROSS/BLUE SHIELD | Admitting: Neurology

## 2015-09-09 HISTORY — PX: SHOULDER ARTHROSCOPY: SHX128

## 2015-12-17 ENCOUNTER — Other Ambulatory Visit: Payer: Self-pay | Admitting: Obstetrics & Gynecology

## 2015-12-17 DIAGNOSIS — Z1231 Encounter for screening mammogram for malignant neoplasm of breast: Secondary | ICD-10-CM

## 2015-12-27 ENCOUNTER — Encounter (HOSPITAL_COMMUNITY): Payer: Self-pay | Admitting: *Deleted

## 2015-12-27 NOTE — Progress Notes (Signed)
Patient denies chest pain or shortness of breath.  Patient does not have a PCP.

## 2015-12-27 NOTE — H&P (Signed)
  Kimberly Vance is an 45 y.o. female.    Chief Complaint: right shoulder pain stiffness  HPI: Pt is a 45 y.o. female complaining of right shoulder stiffness s/p surgery 4 months ago Pain had continually increased since the beginning. Pt has tried various conservative treatments which have failed to alleviate their symptoms, including injections and therapy. Various options are discussed with the patient. Risks, benefits and expectations were discussed with the patient. Patient understand the risks, benefits and expectations and wishes to proceed with surgery.   PCP:  No PCP Per Patient  D/C Plans: Home  PMH: No past medical history on file.  PSH: Past Surgical History  Procedure Laterality Date  . Cholecystectomy    . Tonsillectomy      Social History:  reports that she has been smoking.  She does not have any smokeless tobacco history on file. She reports that she drinks alcohol. She reports that she does not use illicit drugs.  Allergies:  Allergies  Allergen Reactions  . Erythromycin Itching    Medications: No current facility-administered medications for this encounter.   No current outpatient prescriptions on file.    No results found for this or any previous visit (from the past 48 hour(s)). No results found.  ROS: Pain with rom of the right upper extremity  Physical Exam:  Alert and oriented 45 y.o. female in no acute distress Cranial nerves 2-12 intact Cervical spine: full rom with no tenderness, nv intact distally Chest: active breath sounds bilaterally, no wheeze rhonchi or rales Heart: regular rate and rhythm, no murmur Abd: non tender non distended with active bowel sounds Hip is stable with rom  Right shoulder with moderate stiffness with ER and IR nv intact distally No rashes or edema  Assessment/Plan Assessment: right shoulder stiffness s/p surgery Plan: Patient will undergo [facility] right shoulder scope and [facility]  Risks benefits and expectations were discussed with the patient. Patient understand risks, benefits and expectations and wishes to proceed.

## 2015-12-30 ENCOUNTER — Encounter (HOSPITAL_COMMUNITY)
Admission: RE | Disposition: A | Discharge status: Home / Self Care | Payer: Self-pay | Source: Ambulatory Visit | Attending: Orthopedic Surgery

## 2015-12-30 ENCOUNTER — Ambulatory Visit (HOSPITAL_COMMUNITY): Payer: BLUE CROSS/BLUE SHIELD | Admitting: Certified Registered Nurse Anesthetist

## 2015-12-30 ENCOUNTER — Encounter (HOSPITAL_COMMUNITY): Payer: Self-pay | Admitting: *Deleted

## 2015-12-30 ENCOUNTER — Ambulatory Visit (HOSPITAL_COMMUNITY)
Admission: RE | Admit: 2015-12-30 | Discharge: 2015-12-30 | Disposition: A | Discharge status: Home / Self Care | Payer: BLUE CROSS/BLUE SHIELD | Source: Ambulatory Visit | Attending: Orthopedic Surgery | Admitting: Orthopedic Surgery

## 2015-12-30 DIAGNOSIS — Z79899 Other long term (current) drug therapy: Secondary | ICD-10-CM | POA: Diagnosis not present

## 2015-12-30 DIAGNOSIS — M25611 Stiffness of right shoulder, not elsewhere classified: Secondary | ICD-10-CM | POA: Insufficient documentation

## 2015-12-30 DIAGNOSIS — Z87891 Personal history of nicotine dependence: Secondary | ICD-10-CM | POA: Insufficient documentation

## 2015-12-30 HISTORY — PX: SHOULDER ARTHROSCOPY: SHX128

## 2015-12-30 HISTORY — DX: Adverse effect of unspecified anesthetic, initial encounter: T41.45XA

## 2015-12-30 HISTORY — DX: Other seasonal allergic rhinitis: J30.2

## 2015-12-30 HISTORY — DX: Other complications of anesthesia, initial encounter: T88.59XA

## 2015-12-30 LAB — HEMOGLOBIN: HEMOGLOBIN: 14.4 g/dL (ref 12.0–15.0)

## 2015-12-30 SURGERY — ARTHROSCOPY, SHOULDER
Anesthesia: General | Site: Shoulder | Laterality: Right

## 2015-12-30 MED ORDER — BUPIVACAINE-EPINEPHRINE (PF) 0.25% -1:200000 IJ SOLN
INTRAMUSCULAR | Status: AC
  Filled 2015-12-30: qty 30

## 2015-12-30 MED ORDER — KETOROLAC TROMETHAMINE 30 MG/ML IJ SOLN
INTRAMUSCULAR | Status: AC
  Filled 2015-12-30: qty 1

## 2015-12-30 MED ORDER — FENTANYL CITRATE (PF) 100 MCG/2ML IJ SOLN
INTRAMUSCULAR | Status: AC
  Filled 2015-12-30: qty 2

## 2015-12-30 MED ORDER — ONDANSETRON HCL 4 MG/2ML IJ SOLN
INTRAMUSCULAR | Status: DC | PRN
  Administered 2015-12-30: 4 mg via INTRAVENOUS

## 2015-12-30 MED ORDER — SUGAMMADEX SODIUM 200 MG/2ML IV SOLN
INTRAVENOUS | Status: DC | PRN
  Administered 2015-12-30: 200 mg via INTRAVENOUS

## 2015-12-30 MED ORDER — FENTANYL CITRATE (PF) 250 MCG/5ML IJ SOLN
INTRAMUSCULAR | Status: AC
  Filled 2015-12-30: qty 5

## 2015-12-30 MED ORDER — PHENYLEPHRINE HCL 10 MG/ML IJ SOLN
10.0000 mg | INTRAMUSCULAR | Status: DC | PRN
  Administered 2015-12-30: 15 ug/min via INTRAVENOUS

## 2015-12-30 MED ORDER — METHOCARBAMOL 500 MG PO TABS: 500.0000 mg | ORAL_TABLET | Freq: Three times a day (TID) | ORAL | Status: DC | PRN

## 2015-12-30 MED ORDER — DIPHENHYDRAMINE HCL 50 MG/ML IJ SOLN
INTRAMUSCULAR | Status: DC | PRN
  Administered 2015-12-30: 25 mg via INTRAVENOUS

## 2015-12-30 MED ORDER — MIDAZOLAM HCL 5 MG/5ML IJ SOLN
INTRAMUSCULAR | Status: DC | PRN
  Administered 2015-12-30: 2 mg via INTRAVENOUS

## 2015-12-30 MED ORDER — SODIUM CHLORIDE 0.9 % IR SOLN
Status: DC | PRN
  Administered 2015-12-30: 1000 mL

## 2015-12-30 MED ORDER — HYDROCODONE-ACETAMINOPHEN 5-325 MG PO TABS: 1.0000 | ORAL_TABLET | ORAL | Status: DC | PRN

## 2015-12-30 MED ORDER — LACTATED RINGERS IV SOLN: INTRAVENOUS | Status: DC

## 2015-12-30 MED ORDER — DEXAMETHASONE SODIUM PHOSPHATE 4 MG/ML IJ SOLN
INTRAMUSCULAR | Status: DC | PRN
  Administered 2015-12-30: 4 mg via INTRAVENOUS

## 2015-12-30 MED ORDER — CHLORHEXIDINE GLUCONATE 4 % EX LIQD: 60.0000 mL | Freq: Once | CUTANEOUS | Status: DC

## 2015-12-30 MED ORDER — PROMETHAZINE HCL 25 MG/ML IJ SOLN
6.2500 mg | INTRAMUSCULAR | Status: DC | PRN
Start: 2015-12-30 — End: 2015-12-30

## 2015-12-30 MED ORDER — MIDAZOLAM HCL 2 MG/2ML IJ SOLN
INTRAMUSCULAR | Status: AC
Start: 2015-12-30 — End: 2015-12-30
  Administered 2015-12-30: 2 mg via INTRAVENOUS
  Filled 2015-12-30: qty 2

## 2015-12-30 MED ORDER — DIPHENHYDRAMINE HCL 50 MG/ML IJ SOLN
INTRAMUSCULAR | Status: AC
  Filled 2015-12-30: qty 1

## 2015-12-30 MED ORDER — ROCURONIUM BROMIDE 100 MG/10ML IV SOLN
INTRAVENOUS | Status: DC | PRN
  Administered 2015-12-30: 50 mg via INTRAVENOUS

## 2015-12-30 MED ORDER — FENTANYL CITRATE (PF) 100 MCG/2ML IJ SOLN: 25.0000 ug | INTRAMUSCULAR | Status: DC | PRN

## 2015-12-30 MED ORDER — MEPERIDINE HCL 25 MG/ML IJ SOLN: 6.2500 mg | INTRAMUSCULAR | Status: DC | PRN

## 2015-12-30 MED ORDER — FENTANYL CITRATE (PF) 100 MCG/2ML IJ SOLN
50.0000 ug | Freq: Once | INTRAMUSCULAR | Status: AC
  Administered 2015-12-30: 50 ug via INTRAVENOUS
  Filled 2015-12-30: qty 1

## 2015-12-30 MED ORDER — MIDAZOLAM HCL 2 MG/2ML IJ SOLN
INTRAMUSCULAR | Status: AC
  Filled 2015-12-30: qty 2

## 2015-12-30 MED ORDER — LACTATED RINGERS IV SOLN
INTRAVENOUS | Status: DC
  Administered 2015-12-30 (×2): via INTRAVENOUS

## 2015-12-30 MED ORDER — BUPIVACAINE-EPINEPHRINE (PF) 0.5% -1:200000 IJ SOLN
INTRAMUSCULAR | Status: DC | PRN
  Administered 2015-12-30: 20 mL via PERINEURAL

## 2015-12-30 MED ORDER — PROPOFOL 10 MG/ML IV BOLUS
INTRAVENOUS | Status: DC | PRN
  Administered 2015-12-30: 180 mg via INTRAVENOUS

## 2015-12-30 MED ORDER — KETOROLAC TROMETHAMINE 30 MG/ML IJ SOLN
INTRAMUSCULAR | Status: DC | PRN
  Administered 2015-12-30: 30 mg via INTRAVENOUS

## 2015-12-30 MED ORDER — BUPIVACAINE-EPINEPHRINE 0.25% -1:200000 IJ SOLN
INTRAMUSCULAR | Status: DC | PRN
  Administered 2015-12-30: 10 mL

## 2015-12-30 MED ORDER — SUGAMMADEX SODIUM 200 MG/2ML IV SOLN
INTRAVENOUS | Status: AC
  Filled 2015-12-30: qty 2

## 2015-12-30 MED ORDER — CEFAZOLIN SODIUM-DEXTROSE 2-3 GM-% IV SOLR
2.0000 g | INTRAVENOUS | Status: AC
  Administered 2015-12-30: 2 g via INTRAVENOUS

## 2015-12-30 MED ORDER — FENTANYL CITRATE (PF) 100 MCG/2ML IJ SOLN
INTRAMUSCULAR | Status: DC | PRN
  Administered 2015-12-30 (×3): 50 ug via INTRAVENOUS

## 2015-12-30 MED ORDER — CEFAZOLIN SODIUM-DEXTROSE 2-3 GM-% IV SOLR
INTRAVENOUS | Status: AC
  Filled 2015-12-30: qty 50

## 2015-12-30 MED ORDER — MIDAZOLAM HCL 2 MG/2ML IJ SOLN: 2.0000 mg | Freq: Once | INTRAMUSCULAR | Status: DC

## 2015-12-30 SURGICAL SUPPLY — 49 items
BLADE CUDA 4.2 (BLADE) IMPLANT
BLADE SURG 11 STRL SS (BLADE) ×2 IMPLANT
BUR OVAL 4.0 (BURR) IMPLANT
COVER SURGICAL LIGHT HANDLE (MISCELLANEOUS) ×2 IMPLANT
DRAPE IMP U-DRAPE 54X76 (DRAPES) ×2 IMPLANT
DRAPE INCISE IOBAN 66X45 STRL (DRAPES) ×2 IMPLANT
DRAPE STERI 35X30 U-POUCH (DRAPES) ×2 IMPLANT
DRAPE U-SHAPE 47X51 STRL (DRAPES) ×2 IMPLANT
DRSG EMULSION OIL 3X3 NADH (GAUZE/BANDAGES/DRESSINGS) ×4 IMPLANT
DRSG PAD ABDOMINAL 8X10 ST (GAUZE/BANDAGES/DRESSINGS) ×2 IMPLANT
DURAPREP 26ML APPLICATOR (WOUND CARE) ×2 IMPLANT
ELECT REM PT RETURN 9FT ADLT (ELECTROSURGICAL) ×2
ELECTRODE REM PT RTRN 9FT ADLT (ELECTROSURGICAL) ×1 IMPLANT
GAUZE SPONGE 4X4 12PLY STRL (GAUZE/BANDAGES/DRESSINGS) ×2 IMPLANT
GLOVE BIOGEL PI ORTHO PRO 7.5 (GLOVE) ×1
GLOVE BIOGEL PI ORTHO PRO SZ8 (GLOVE) ×1
GLOVE ORTHO TXT STRL SZ7.5 (GLOVE) ×2 IMPLANT
GLOVE PI ORTHO PRO STRL 7.5 (GLOVE) ×1 IMPLANT
GLOVE PI ORTHO PRO STRL SZ8 (GLOVE) ×1 IMPLANT
GLOVE SURG ORTHO 8.5 STRL (GLOVE) ×2 IMPLANT
GOWN STRL REUS W/ TWL XL LVL3 (GOWN DISPOSABLE) ×4 IMPLANT
GOWN STRL REUS W/TWL XL LVL3 (GOWN DISPOSABLE) ×4
KIT BASIN OR (CUSTOM PROCEDURE TRAY) ×2 IMPLANT
KIT ROOM TURNOVER OR (KITS) ×2 IMPLANT
MANIFOLD NEPTUNE II (INSTRUMENTS) ×2 IMPLANT
NEEDLE HYPO 25GX1X1/2 BEV (NEEDLE) ×2 IMPLANT
NEEDLE SPNL 18GX3.5 QUINCKE PK (NEEDLE) ×2 IMPLANT
NS IRRIG 1000ML POUR BTL (IV SOLUTION) ×2 IMPLANT
PACK SHOULDER (CUSTOM PROCEDURE TRAY) ×2 IMPLANT
PACK UNIVERSAL I (CUSTOM PROCEDURE TRAY) ×2 IMPLANT
PAD ARMBOARD 7.5X6 YLW CONV (MISCELLANEOUS) ×4 IMPLANT
RESECTOR FULL RADIUS 4.2MM (BLADE) ×4 IMPLANT
SET ARTHROSCOPY TUBING (MISCELLANEOUS) ×1
SET ARTHROSCOPY TUBING LN (MISCELLANEOUS) ×1 IMPLANT
SLING ARM FOAM STRAP LRG (SOFTGOODS) ×2 IMPLANT
SLING ARM LRG ADULT FOAM STRAP (SOFTGOODS) ×2 IMPLANT
SLING ARM MED ADULT FOAM STRAP (SOFTGOODS) IMPLANT
STRIP CLOSURE SKIN 1/2X4 (GAUZE/BANDAGES/DRESSINGS) ×2 IMPLANT
SUT ETHILON 3 0 PS 1 (SUTURE) ×2 IMPLANT
SUT VIC AB 0 CT2 27 (SUTURE) IMPLANT
SUT VIC AB 2-0 CT1 27 (SUTURE) ×1
SUT VIC AB 2-0 CT1 TAPERPNT 27 (SUTURE) ×1 IMPLANT
SUT VICRYL 0 CT 1 36IN (SUTURE) ×2 IMPLANT
SYR CONTROL 10ML LL (SYRINGE) ×2 IMPLANT
TOWEL OR 17X24 6PK STRL BLUE (TOWEL DISPOSABLE) ×2 IMPLANT
TOWEL OR 17X26 10 PK STRL BLUE (TOWEL DISPOSABLE) ×2 IMPLANT
TUBE CONNECTING 12X1/4 (SUCTIONS) ×2 IMPLANT
WAND HAND CNTRL MULTIVAC 90 (MISCELLANEOUS) ×4 IMPLANT
WATER STERILE IRR 1000ML POUR (IV SOLUTION) ×2 IMPLANT

## 2015-12-30 NOTE — Brief Op Note (Signed)
12/30/2015  5:45 PM  PATIENT:  Kimberly Vance  45 y.o. female  PRE-OPERATIVE DIAGNOSIS:  RIGHT SHOULDER STIFFNESS AFTER ROTATOR CUFF REPAIR   POST-OPERATIVE DIAGNOSIS:  RIGHT SHOULDER STIFFNESS AFTER ROTATOR CUFF REPAIR  PROCEDURE:  Procedure(s): RIGHT SHOULDER EXAM UNDER ANESTHESIA MANIPULATION UNDER ANESTHESIA  ARTHROSCOPIC CAPSULAR RELEASE AND SCAR RELEASE  (Right)  SURGEON:  Surgeon(s) and Role:    * Beverely LowSteve Christine Morton, MD - Primary  PHYSICIAN ASSISTANT:   ASSISTANTS: Thea Gisthomas B Dixon, PA-C   ANESTHESIA:   regional and general  EBL:  Total I/O In: 1000 [I.V.:1000] Out: 20 [Blood:20]  BLOOD ADMINISTERED:none  DRAINS: none   LOCAL MEDICATIONS USED:  MARCAINE     SPECIMEN:  No Specimen  DISPOSITION OF SPECIMEN:  N/A  COUNTS:  YES  TOURNIQUET:  * No tourniquets in log *  DICTATION: .Other Dictation #[id number]  PLAN OF CARE: Discharge to home after PACU  PATIENT DISPOSITION:  PACU - hemodynamically stable.   Delay start of Pharmacological VTE agent (>24hrs) due to surgical blood loss or risk of bleeding: not applicable

## 2015-12-30 NOTE — Anesthesia Procedure Notes (Addendum)
Anesthesia Regional Block:  Supraclavicular block  Pre-Anesthetic Checklist: ,, timeout performed, Correct Patient, Correct Site, Correct Laterality, Correct Procedure, Correct Position, site marked, Risks and benefits discussed,  Surgical consent,  Pre-op evaluation,  At surgeon's request and post-op pain management  Laterality: Right and Upper  Prep: Maximum Sterile Barrier Precautions used and chloraprep       Needles:  Injection technique: Single-shot  Needle Type: Echogenic Stimulator Needle     Needle Length: 10cm 10 cm Needle Gauge: 21 and 21 G    Additional Needles:  Procedures: ultrasound guided (picture in chart) Supraclavicular block Narrative:  Injection made incrementally with aspirations every 5 mL.  Performed by: Personally   Additional Notes: Risks, benefits and alternative to block explained extensively.  Patient tolerated procedure well, without complications.   Procedure Name: Intubation Date/Time: 12/30/2015 4:29 PM Performed by: Fabian NovemberSOLHEIM, Jaquia Benedicto SALOMAN Pre-anesthesia Checklist: Patient identified, Patient being monitored, Timeout performed, Emergency Drugs available and Suction available Patient Re-evaluated:Patient Re-evaluated prior to inductionOxygen Delivery Method: Circle System Utilized Preoxygenation: Pre-oxygenation with 100% oxygen Intubation Type: IV induction Ventilation: Mask ventilation without difficulty Laryngoscope Size: Miller and 3 Grade View: Grade I Tube type: Oral Tube size: 7.5 mm Number of attempts: 1 Airway Equipment and Method: Stylet Placement Confirmation: ETT inserted through vocal cords under direct vision,  positive ETCO2 and breath sounds checked- equal and bilateral Secured at: 21 cm Tube secured with: Tape Dental Injury: Teeth and Oropharynx as per pre-operative assessment

## 2015-12-30 NOTE — Op Note (Signed)
NAMMarland Kitchen:  Kimberly Vance, Kimberly Vance                ACCOUNT NO.:  [id number]  MEDICAL RECORD NO.:  [id number]  LOCATION:  [facility]                         FACILITY:  [facility]  PHYSICIAN:  Almedia BallsSteven R. Ranell PatrickNorris, M.D. DATE OF BIRTH:  1970-11-07  DATE OF PROCEDURE:  12/30/2015 DATE OF DISCHARGE:  12/30/2015                              OPERATIVE REPORT   PREOPERATIVE DIAGNOSIS:  Right shoulder stiffness following rotator cuff repair.  POSTOPERATIVE DIAGNOSIS:  Right shoulder stiffness following rotator cuff repair including arthrofibrosis.  PROCEDURE PERFORMED:  Right shoulder exam under anesthesia, manipulation anesthesia, shoulder arthroscopy with arthroscopic capsular release, arthroscopic scar release, subdeltoid subacromial.  ATTENDING SURGEON:  Almedia BallsSteven R. Ranell PatrickNorris, MD  ASSISTANT:  Donnie Coffinhomas B. Dixon, PA-C, who has scrubbed the entire procedure and necessary for satisfactory completion of surgery, manipulation, and positioning for getting to the capsule release especially inferiorly, and inferoposteriorly, and as well positioning for scar release subdeltoid.  ANESTHESIA:  General anesthesia was used plus interscalene block.  ESTIMATED BLOOD LOSS:  Minimal.  FLUID REPLACEMENT:  1200 mL crystalloids.  INSTRUMENT COUNTS:  Correct.  COMPLICATIONS:  There were no complications.  ANTIBIOTICS:  Perioperative antibiotics were given.  INDICATIONS:  The patient is a 45 year old female with severe right shoulder stiffness following shoulder arthroscopy and cuff repair 4 months ago.  The patient has had a failure to progress with physical therapy over several months.  This has nonfunctional range of motion at this point and about 30-45 degrees of internal-external rotation arc and then under 9 degrees forward elevation.  After counseling the patient regarding options for management, we recommended shoulder exam under anesthesia, manipulation under anesthesia, capsular release, and scar release.  Patient agreed.   Informed consent obtained.  DESCRIPTION OF PROCEDURE:  After adequate level of anesthesia achieved, the patient was positioned in the modified beach-chair position.  Right shoulder correctly identified and time-out was called.  We did an exam under anesthesia.  Patient had external rotation of about 0 and internal rotation 30.  Forward elevation is approximately 70, abduction 30.  We did a gentle progressive manipulation under anesthesia, really did not get much look on forward plane or the abduction plane, but we did with cross-arm adduction and internal rotation with little bit of pop and an improvement in cross-arm adduction and internal rotation.  We tried to re-manipulate in the forward plane and the abducted plane, just felt like it was too risky, did want not want to have a fracture.  We then at this point, sterilely prepped and draped the shoulder and arm, re- verified a time-out.  Then, we entered the shoulder in standard portals. We found significant capsular thickening consistent with adhesive capsulitis.  We performed a complete release of the capsule including the rotator interval.  Subscapularis rolled edge was normal.  Rotator cuff repair looked perfect with the tendon reapproximated to the greater tuberosity.  We took pictures of that.  We did a 360-degree capsular release, careful inferiorly using basket forceps.  We reversed the shoulder looking posteriorly.  We had a nice capsular release posteriorly with our manipulation, and then we just completed the posterior inferior with the biter and the ArthroCare unit.  We then placed a  scope in subacromial space.  There was quite a bit of scarring up in that area which I was surprised with that and then we did shaver and also the ArthroCare unit to remove scar tissue in the subdeltoid subacromial plane.  We were able to visualize rotator cuff from the bursal surface and it looked perfect.  No signs of any recurrent tears. We  just did a scar release there to release between those planes using the obturator for just some sweeps between the 2 planes and then also some debridement using the shaver and the ArthroCare.  We re- manipulated, patient had full motion.  At this point, we concluded surgery, suturing was with 3-0 nylon, added a sterile compressive bandage, and the patient was placed in shoulder sling, taken to recovery room in stable condition.     Almedia Balls. Ranell Patrick, M.D.     SRN/MEDQ  D:  12/30/2015  T:  12/30/2015  Job:  [id number]

## 2015-12-30 NOTE — Anesthesia Preprocedure Evaluation (Addendum)
Anesthesia Evaluation  Patient identified by MRN, date of birth, ID band Patient awake    Reviewed: Allergy & Precautions, NPO status , Patient's Chart, lab work & pertinent test results  Airway Mallampati: II  TM Distance: >3 FB Neck ROM: Full    Dental no notable dental hx.    Pulmonary neg pulmonary ROS, former smoker,    Pulmonary exam normal breath sounds clear to auscultation       Cardiovascular negative cardio ROS Normal cardiovascular exam Rhythm:Regular Rate:Normal     Neuro/Psych negative neurological ROS  negative psych ROS   GI/Hepatic negative GI ROS, Neg liver ROS,   Endo/Other  negative endocrine ROS  Renal/GU negative Renal ROS  negative genitourinary   Musculoskeletal negative musculoskeletal ROS (+)   Abdominal   Peds negative pediatric ROS (+)  Hematology negative hematology ROS (+)   Anesthesia Other Findings   Reproductive/Obstetrics negative OB ROS                            Anesthesia Physical Anesthesia Plan  ASA: II  Anesthesia Plan: General   Post-op Pain Management: GA combined w/ Regional for post-op pain   Induction: Intravenous  Airway Management Planned: Oral ETT  Additional Equipment:   Intra-op Plan:   Post-operative Plan: Extubation in OR  Informed Consent: I have reviewed the patients History and Physical, chart, labs and discussed the procedure including the risks, benefits and alternatives for the proposed anesthesia with the patient or authorized representative who has indicated his/her understanding and acceptance.   Dental advisory given  Plan Discussed with: CRNA  Anesthesia Plan Comments: (SCB for post-op pain)        Anesthesia Quick Evaluation

## 2015-12-30 NOTE — Interval H&P Note (Signed)
History and Physical Interval Note:  12/30/2015 3:55 PM  Kimberly Vance  has presented today for surgery, with the diagnosis of RIGHT SHOULDER STIFFNESS AFTER ROTATOR CUFF REPAIR   The various methods of treatment have been discussed with the patient and family. After consideration of risks, benefits and other options for treatment, the patient has consented to  Procedure(s): RIGHT SHOULDER EXAM UNDER ANESTHESIA MANIPULATION UNDER ANESTHESIA  ARTHROSCOPIC CAPSULAR RELEASE AND SCAR RELEASE  (Right) as a surgical intervention .  The patient's history has been reviewed, patient examined, no change in status, stable for surgery.  I have reviewed the patient's chart and labs.  Questions were answered to the patient's satisfaction.     Sherian Valenza,STEVEN R

## 2015-12-30 NOTE — Discharge Instructions (Signed)
Ice to the shoulder as much as you can.  Keep the incisions clean and dry for 5 days then ok to get wet in the shower.  Do exercises every hour while awake including the pulley and the stick(rotation)  Ok to remove the sling as much as you would like and use the arm as tolerated.  Can hug a pillow in the house for support of the arm.  Therapy tomorrow.  Follow up with Dr Ranell PatrickNorris in one week in the office, call [phone] for appointment.

## 2015-12-30 NOTE — Transfer of Care (Signed)
Immediate Anesthesia Transfer of Care Note  Patient: Kimberly Vance  Procedure(s) Performed: Procedure(s): RIGHT SHOULDER EXAM UNDER ANESTHESIA MANIPULATION UNDER ANESTHESIA  ARTHROSCOPIC CAPSULAR RELEASE AND SCAR RELEASE  (Right)  Patient Location: PACU  Anesthesia Type:GA combined with regional for post-op pain  Level of Consciousness: awake, alert  and patient cooperative  Airway & Oxygen Therapy: Patient Spontanous Breathing and Patient connected to nasal cannula oxygen  Post-op Assessment: Report given to RN and Post -op Vital signs reviewed and stable  Post vital signs: Reviewed and stable  Last Vitals:  Filed Vitals:   12/30/15 1511 12/30/15 1531  BP: 114/68 114/62  Pulse: 74 75  Temp:    Resp: 19 15    Complications: No apparent anesthesia complications

## 2015-12-31 ENCOUNTER — Encounter (HOSPITAL_COMMUNITY): Payer: Self-pay | Admitting: Orthopedic Surgery

## 2016-01-01 ENCOUNTER — Other Ambulatory Visit: Payer: Self-pay | Admitting: Obstetrics & Gynecology

## 2016-01-01 ENCOUNTER — Ambulatory Visit (HOSPITAL_COMMUNITY): Payer: BLUE CROSS/BLUE SHIELD

## 2016-01-01 NOTE — Addendum Note (Signed)
Addendum  created 01/01/16 2126 by Sheldon Silvanavid Travarius Lange, MD   Modules edited: Anesthesia Responsible Staff

## 2016-01-01 NOTE — Anesthesia Postprocedure Evaluation (Signed)
Anesthesia Post Note  Patient: Kimberly Vance  Procedure(s) Performed: Procedure(s) (LRB): RIGHT SHOULDER EXAM UNDER ANESTHESIA MANIPULATION UNDER ANESTHESIA  ARTHROSCOPIC CAPSULAR RELEASE AND SCAR RELEASE  (Right)  Patient location during evaluation: PACU Anesthesia Type: General Level of consciousness: awake and alert Pain management: pain level controlled Vital Signs Assessment: post-procedure vital signs reviewed and stable Respiratory status: spontaneous breathing, nonlabored ventilation and respiratory function stable Cardiovascular status: blood pressure returned to baseline and stable Postop Assessment: no signs of nausea or vomiting Anesthetic complications: no    Last Vitals:  Filed Vitals:   12/30/15 1843 12/30/15 1845  BP: 115/75   Pulse: 69 66  Temp:  36.4 C  Resp: 17 17    Last Pain:  Filed Vitals:   12/30/15 1847  PainSc: 0-No pain                 Gracelin Weisberg A

## 2016-01-29 ENCOUNTER — Ambulatory Visit (HOSPITAL_COMMUNITY): Payer: BLUE CROSS/BLUE SHIELD

## 2016-01-29 ENCOUNTER — Other Ambulatory Visit: Payer: Self-pay | Admitting: Obstetrics & Gynecology

## 2016-05-06 ENCOUNTER — Ambulatory Visit (HOSPITAL_COMMUNITY): Payer: BLUE CROSS/BLUE SHIELD

## 2016-05-06 ENCOUNTER — Ambulatory Visit: Payer: Self-pay | Admitting: Allergy and Immunology

## 2016-05-06 ENCOUNTER — Other Ambulatory Visit: Payer: Self-pay | Admitting: Obstetrics & Gynecology

## 2016-05-11 ENCOUNTER — Ambulatory Visit (HOSPITAL_COMMUNITY): Payer: BLUE CROSS/BLUE SHIELD

## 2016-05-18 ENCOUNTER — Ambulatory Visit (INDEPENDENT_AMBULATORY_CARE_PROVIDER_SITE_OTHER): Payer: BLUE CROSS/BLUE SHIELD | Admitting: Obstetrics & Gynecology

## 2016-05-18 ENCOUNTER — Encounter: Payer: Self-pay | Admitting: Obstetrics & Gynecology

## 2016-05-18 ENCOUNTER — Other Ambulatory Visit (HOSPITAL_COMMUNITY)
Admission: RE | Admit: 2016-05-18 | Discharge: 2016-05-18 | Disposition: A | Discharge status: Home / Self Care | Payer: BLUE CROSS/BLUE SHIELD | Source: Ambulatory Visit | Attending: Obstetrics & Gynecology | Admitting: Obstetrics & Gynecology

## 2016-05-18 ENCOUNTER — Ambulatory Visit (HOSPITAL_COMMUNITY)
Admission: RE | Admit: 2016-05-18 | Discharge: 2016-05-18 | Disposition: A | Discharge status: Home / Self Care | Payer: BLUE CROSS/BLUE SHIELD | Source: Ambulatory Visit | Attending: Obstetrics & Gynecology | Admitting: Obstetrics & Gynecology

## 2016-05-18 VITALS — BP 128/84 | HR 60 | Ht 66.0 in | Wt 215.0 lb

## 2016-05-18 DIAGNOSIS — Z01419 Encounter for gynecological examination (general) (routine) without abnormal findings: Secondary | ICD-10-CM | POA: Insufficient documentation

## 2016-05-18 DIAGNOSIS — Z1231 Encounter for screening mammogram for malignant neoplasm of breast: Secondary | ICD-10-CM | POA: Diagnosis not present

## 2016-05-18 DIAGNOSIS — Z1151 Encounter for screening for human papillomavirus (HPV): Secondary | ICD-10-CM | POA: Insufficient documentation

## 2016-05-18 NOTE — Progress Notes (Signed)
Subjective:     Kimberly Vance is a 45 y.o. female here for a routine exam.  No LMP recorded. Patient is postmenopausal. No obstetric history on file. Birth Control Method:  Post menopausal Menstrual Calendar(currently): amenorrheic  Current complaints: none.   Current acute medical issues:  none   Recent Gynecologic History No LMP recorded. Patient is postmenopausal. Last Pap: 2016,  normal Last mammogram: 2015,  normal  Past Medical History:  Diagnosis Date  . Complication of anesthesia    Woke up unaware of where she was  . Seasonal allergies   . Tick bite     Past Surgical History:  Procedure Laterality Date  . CHOLECYSTECTOMY    . SHOULDER ARTHROSCOPY Right 09/09/15   Reattached Rotatar tendon and a ligament  . SHOULDER ARTHROSCOPY Right 12/30/2015   Procedure: RIGHT SHOULDER EXAM UNDER ANESTHESIA MANIPULATION UNDER ANESTHESIA  ARTHROSCOPIC CAPSULAR RELEASE AND SCAR RELEASE ;  Surgeon: Beverely LowSteve Norris, MD;  [facility];  Service: Orthopedics;  Laterality: Right;  . TONSILLECTOMY      OB History    No data available      Social History   Social History  . Marital status: Single    Spouse name: N/A  . Number of children: N/A  . Years of education: N/A   Social History Main Topics  . Smoking status: Former Smoker    Years: 1.00  . Smokeless tobacco: Never Used     Comment: 1 pack every 2 weeks - none sincwe 08/2015  . Alcohol use 0.0 oz/week     Comment: 1-2x per week/month  . Drug use: No  . Sexual activity: Yes   Other Topics Concern  . None   Social History Narrative   Lives with son in one story home.     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      Family History  Problem Relation Age of Onset  . Hypertension Mother   . Other Mother     hypogylcemia  . Diabetes Father   . Hypertension Father   . Diabetes Maternal  Grandmother   . Diabetes Paternal Grandmother   . Healthy Son      Current Outpatient Prescriptions:  .  albuterol (PROVENTIL HFA;VENTOLIN HFA) 108 (90 Base) MCG/ACT inhaler, Inhale 1-2 puffs into the lungs every 4 (four) hours as needed for wheezing or shortness of breath., Disp: , Rfl:  .  cetirizine (ZYRTEC) 10 MG tablet, Take 10 mg by mouth daily as needed for allergies., Disp: , Rfl:  .  EPINEPHrine 0.3 mg/0.3 mL IJ SOAJ injection, Inject 0.3 mg into the muscle once., Disp: , Rfl:  .  methocarbamol (ROBAXIN) 500 MG tablet, Take 1 tablet (500 mg total) by mouth 3 (three) times daily as needed., Disp: 60 tablet, Rfl: 1 .  mometasone (NASONEX) 50 MCG/ACT nasal spray, Place 2 sprays into the nose daily as needed., Disp: , Rfl:  .  montelukast (SINGULAIR) 10 MG tablet, Take 10 mg by mouth at bedtime as needed., Disp: , Rfl:   Review of Systems  Review of Systems  Constitutional: Negative for fever, chills, weight loss, malaise/fatigue and diaphoresis.  HENT: Negative for hearing loss, ear pain, nosebleeds, congestion, sore throat, neck pain, tinnitus and ear discharge.   Eyes: Negative for blurred vision, double vision, photophobia, pain, discharge and redness.  Respiratory: Negative for cough, hemoptysis,  sputum production, shortness of breath, wheezing and stridor.   Cardiovascular: Negative for chest pain, palpitations, orthopnea, claudication, leg swelling and PND.  Gastrointestinal: negative for abdominal pain. Negative for heartburn, nausea, vomiting, diarrhea, constipation, blood in stool and melena.  Genitourinary: Negative for dysuria, urgency, frequency, hematuria and flank pain.  Musculoskeletal: Negative for myalgias, back pain, joint pain and falls.  Skin: Negative for itching and rash.  Neurological: Negative for dizziness, tingling, tremors, sensory change, speech change, focal weakness, seizures, loss of consciousness, weakness and headaches.  Endo/Heme/Allergies: Negative  for environmental allergies and polydipsia. Does not bruise/bleed easily.  Psychiatric/Behavioral: Negative for depression, suicidal ideas, hallucinations, memory loss and substance abuse. The patient is not nervous/anxious and does not have insomnia.        Objective:  Blood pressure 128/84, pulse 60, height  (1.676 m), weight 215 lb (97.5 kg).   Physical Exam  Vitals reviewed. Constitutional: She is oriented to person, place, and time. She appears well-developed and well-nourished.  HENT:  Head: Normocephalic and atraumatic.        Right Ear: External ear normal.  Left Ear: External ear normal.  Nose: Nose normal.  Mouth/Throat: Oropharynx is clear and moist.  Eyes: Conjunctivae and EOM are normal. Pupils are equal, round, and reactive to light. Right eye exhibits no discharge. Left eye exhibits no discharge. No scleral icterus.  Neck: Normal range of motion. Neck supple. No tracheal deviation present. No thyromegaly present.  Cardiovascular: Normal rate, regular rhythm, normal heart sounds and intact distal pulses.  Exam reveals no gallop and no friction rub.   No murmur heard. Respiratory: Effort normal and breath sounds normal. No respiratory distress. She has no wheezes. She has no rales. She exhibits no tenderness.  GI: Soft. Bowel sounds are normal. She exhibits no distension and no mass. There is no tenderness. There is no rebound and no guarding.  Genitourinary:  Breasts no masses skin changes or nipple changes bilaterally      Vulva is normal without lesions Vagina is pink moist without discharge Cervix normal in appearance and pap is done Uterus is normal size shape and contour Adnexa is negative with normal sized ovaries   Musculoskeletal: Normal range of motion. She exhibits no edema and no tenderness.  Neurological: She is alert and oriented to person, place, and time. She has normal reflexes. She displays normal reflexes. No cranial nerve deficit. She exhibits  normal muscle tone. Coordination normal.  Skin: Skin is warm and dry. No rash noted. No erythema. No pallor.  Psychiatric: She has a normal mood and affect. Her behavior is normal. Judgment and thought content normal.       Medications Ordered at today's visit: No orders of the defined types were placed in this encounter.   Other orders placed at today's visit: No orders of the defined types were placed in this encounter.     Assessment:    Healthy female exam.    Plan:    Mammogram ordered. Follow up in: 1 years.     No Follow-up on file.

## 2016-05-20 LAB — CYTOLOGY - PAP

## 2016-05-26 ENCOUNTER — Ambulatory Visit (INDEPENDENT_AMBULATORY_CARE_PROVIDER_SITE_OTHER): Payer: BLUE CROSS/BLUE SHIELD | Admitting: Allergy & Immunology

## 2016-05-26 ENCOUNTER — Encounter: Payer: Self-pay | Admitting: Allergy & Immunology

## 2016-05-26 ENCOUNTER — Other Ambulatory Visit: Payer: Self-pay

## 2016-05-26 ENCOUNTER — Telehealth: Payer: Self-pay

## 2016-05-26 VITALS — BP 112/70 | HR 88 | Temp 98.2°F | Resp 16 | Ht 64.17 in | Wt 211.0 lb

## 2016-05-26 DIAGNOSIS — J4530 Mild persistent asthma, uncomplicated: Secondary | ICD-10-CM | POA: Diagnosis not present

## 2016-05-26 DIAGNOSIS — T781XXD Other adverse food reactions, not elsewhere classified, subsequent encounter: Secondary | ICD-10-CM | POA: Diagnosis not present

## 2016-05-26 DIAGNOSIS — J310 Chronic rhinitis: Secondary | ICD-10-CM | POA: Diagnosis not present

## 2016-05-26 MED ORDER — MONTELUKAST SODIUM 10 MG PO TABS: 10.0000 mg | ORAL_TABLET | Freq: Every evening | ORAL | 11 refills | Status: DC | PRN

## 2016-05-26 MED ORDER — EPINEPHRINE 0.3 MG/0.3ML IJ SOAJ: 0.3000 mg | Freq: Once | INTRAMUSCULAR | 1 refills | Status: AC

## 2016-05-26 MED ORDER — MOMETASONE FUROATE 50 MCG/ACT NA SUSP: 2.0000 | Freq: Every day | NASAL | 11 refills | Status: DC | PRN

## 2016-05-26 MED ORDER — MOMETASONE FUROATE 50 MCG/ACT NA SUSP: NASAL | 11 refills | Status: DC

## 2016-05-26 MED ORDER — CETIRIZINE HCL 10 MG PO TABS: ORAL_TABLET | ORAL | 11 refills | Status: DC

## 2016-05-26 MED ORDER — ALBUTEROL SULFATE HFA 108 (90 BASE) MCG/ACT IN AERS: 1.0000 | INHALATION_SPRAY | RESPIRATORY_TRACT | 1 refills | Status: DC | PRN

## 2016-05-26 NOTE — Patient Instructions (Signed)
1. Alpha gal sensitivity - Testing was done last year, therefore we can defer testing again until next year.  - Continue to avoid red meats. - EpiPen refilled. If the copay is too pricey, call us back and we can prescribe the AuviQ instead which has a free copay right now.   2. Chronic rhinitis - Continue Singulair, cetirizine, Nasonex  3. Mild persistent asthma, uncomplicated - Continue Singulair as needed.  - Continue albuterol as needed.  4. Return to clinic in one year or earlier if needed.  It was a pleasure meeting you today! Good luck with the new school year and the [place] trip!

## 2016-05-26 NOTE — Telephone Encounter (Signed)
Pt called back and advised her she can buy OTC and she agreed she would do that for her Nasonex.

## 2016-05-26 NOTE — Progress Notes (Addendum)
FOLLOW UP  Date of Service/Encounter:  05/26/16   Assessment:   Adverse food reaction, subsequent encounter  Chronic rhinitis  Mild persistent asthma, uncomplicated   Asthma Reportables:  Severity: : mild persistent  Risk: low Control: well controlled  Seasonal Influenza Vaccine: no but encouraged    Plan/Recommendations:    1. Alpha gal sensitivity - Testing was done last year, therefore we can defer testing again until next year.  - Continue to avoid red meats.  - EpiPen refilled.  - If the copay is too pricey, call us back and we can prescribe the AuviQ instead which has a free copay right now.   2. Chronic rhinitis - Continue Singulair, cetirizine, Nasonex  3. Mild persistent asthma, uncomplicated - Continue Singulair as needed.  - Continue albuterol as needed.  4. Return to clinic in one year or earlier if needed.    Subjective:   Kimberly Vance is a 45 y.o. female presenting today for evaluation of  Chief Complaint  Patient presents with  . Allergy Testing    recheck to see if pt has alpha-gal Med refills (EPIPEN)   .  Kimberly Vance has a history of the following: Patient Active Problem List   Diagnosis Date Noted  . Postmenopausal bleeding 12/04/2013    History obtained from: chart review and patient.  Kimberly BeardsShannon Staten was referred by No PCP Per Patient.     Kimberly Vance is a 45 y.o. female presenting for retesting for alpha-gal. She was last seen in our clinic in May 2016. At that time, she did carry the diagnosis of alpha-gal sensitivity as well as allergic rhinoconjunctivitis and intermittent asthma. Her allergy testing performed in 2015 that was positive to trees [facility]([place]), molds, cat, dog, horse, cockroach, and dust mite. Testing was positive for beef, lamb and pork in 2015 as well. At the last visit, she was doing well. Singulair had recently been added as a controller medication for her asthma. She was also on cetirizine as well as Nasonex. Her last  testing was via blood in April 2016 and she had elevated sIgE to alpha-gal as well as beef, lamb, and pork.   Since the last visit, she reports that she has done well. She is interested in being retested however she did not realize that it had been so recent. She has had no accidental ingestions. She usually carries her EpiPen with her at all times. But her last one expired two months ago.   The last time that she had red meat purposefully was around five years ago. She ended up blacking out and having a rash from head to toe. She had an accidental ingestion when had had shrimp and grits that was contaminated with sausage during spring break 2015. She ended up treating with benadryl.   Kimberly Vance does have a history of mild persistent asthma. She has bene on Singulair for about 18 months with improvement in her symptoms. She takes cetirizine in the morning as needed. She does have a rescue inhaler if she needs it.   Kimberly Vance is going to [place] next summer and needs a letter for carrying the EpiPen on the airline. She gets itching with erythromycin, otherwise she has no drug allergies. Otherwise, there is no history of other atopic diseases, including drug allergies, food allergies, stinging insect allergies, or urticaria. There is no significant infectious history. Vaccinations are up to date. There are no changes to her past medical history aside from a shoulder injury requiring two surgeries now.  Review of Systems: a 14-point review of systems is pertinent for what is mentioned in HPI.  Otherwise, all other systems were negative. Constitutional: negative other than that listed in the HPI Eyes: negative other than that listed in the HPI Ears, nose, mouth, throat, and face: negative other than that listed in the HPI Respiratory: negative other than that listed in the HPI Cardiovascular: negative other than that listed in the HPI Gastrointestinal: negative other than that listed in the  HPI Genitourinary: negative other than that listed in the HPI Integument: negative other than that listed in the HPI Hematologic: negative other than that listed in the HPI Musculoskeletal: negative other than that listed in the HPI Neurological: negative other than that listed in the HPI Allergy/Immunologic: negative other than that listed in the HPI    Objective:   Blood pressure 112/70, pulse 88, temperature 98.2 F (36.8 C), temperature source Oral, resp. rate 16, height 5' 4.17" (1.63 m), weight 211 lb (95.7 kg), SpO2 98 %. Body mass index is 36.02 kg/m.   Physical Exam:  General: Alert, interactive, in no acute distress. HEENT: TMs pearly gray, turbinates minimally edematous without discharge, post-pharynx mildly erythematous. Neck: Supple without lymphadenopathy. Lungs: Clear to auscultation without wheezing, rhonchi or rales. CV: Normal S1, S2 without murmurs. Capillary refill <2 seconds.  Skin:Warm and dry, without lesions or rashes. Extremities: No clubbing, cyanosis or edema. Neuro: Grossly intact. No focal deficits noted   Diagnostic studies:  Spirometry: results normal (FEV1: 2.80/104%, FVC: 3.76/120%, FEV1/FVC: 74%).    Spirometry consistent with normal pattern     Malachi BondsJoel Jerolyn Flenniken, MD [facility]

## 2016-08-12 HISTORY — PX: SHOULDER SURGERY: SHX246

## 2017-03-22 ENCOUNTER — Telehealth: Payer: Self-pay | Admitting: Allergy & Immunology

## 2017-03-22 NOTE — Telephone Encounter (Signed)
Spoke to patient and I faxed over the form.

## 2017-03-22 NOTE — Telephone Encounter (Signed)
Patient is going to [place] and needs a letter stating she can carry her Epi Pen and inhaler on the plane; faxed to her work.  Fax# [phone].

## 2017-07-05 ENCOUNTER — Other Ambulatory Visit: Payer: Self-pay | Admitting: Obstetrics & Gynecology

## 2017-07-05 DIAGNOSIS — Z1231 Encounter for screening mammogram for malignant neoplasm of breast: Secondary | ICD-10-CM

## 2017-07-09 ENCOUNTER — Ambulatory Visit (HOSPITAL_COMMUNITY): Payer: BLUE CROSS/BLUE SHIELD

## 2017-07-09 ENCOUNTER — Ambulatory Visit (HOSPITAL_COMMUNITY)
Admission: RE | Admit: 2017-07-09 | Discharge: 2017-07-09 | Disposition: A | Discharge status: Home / Self Care | Payer: BLUE CROSS/BLUE SHIELD | Source: Ambulatory Visit | Attending: Obstetrics & Gynecology | Admitting: Obstetrics & Gynecology

## 2017-07-09 DIAGNOSIS — Z1231 Encounter for screening mammogram for malignant neoplasm of breast: Secondary | ICD-10-CM | POA: Diagnosis not present

## 2017-07-20 ENCOUNTER — Ambulatory Visit (INDEPENDENT_AMBULATORY_CARE_PROVIDER_SITE_OTHER): Payer: BLUE CROSS/BLUE SHIELD | Admitting: Obstetrics & Gynecology

## 2017-07-20 ENCOUNTER — Encounter: Payer: Self-pay | Admitting: Obstetrics & Gynecology

## 2017-07-20 ENCOUNTER — Other Ambulatory Visit (HOSPITAL_COMMUNITY)
Admission: RE | Admit: 2017-07-20 | Discharge: 2017-07-20 | Disposition: A | Discharge status: Home / Self Care | Payer: BLUE CROSS/BLUE SHIELD | Source: Ambulatory Visit | Attending: Obstetrics & Gynecology | Admitting: Obstetrics & Gynecology

## 2017-07-20 VITALS — BP 124/62 | HR 83 | Ht 66.0 in | Wt 223.0 lb

## 2017-07-20 DIAGNOSIS — Z01419 Encounter for gynecological examination (general) (routine) without abnormal findings: Secondary | ICD-10-CM

## 2017-07-20 NOTE — Progress Notes (Signed)
Subjective:     Kimberly Vance is a 46 y.o. female here for a routine exam.  No LMP recorded. Patient is postmenopausal. G1P1001 Birth Control Method:  Post menopausal Menstrual Calendar(currently): amenorrheic x 3 years 2015  Current complaints: none.   Current acute medical issues:  none   Recent Gynecologic History No LMP recorded. Patient is postmenopausal. Last Pap: 2017,  normal Last mammogram: 07/09/2017,  normal  Past Medical History:  Diagnosis Date  . Complication of anesthesia    Woke up unaware of where she was  . Seasonal allergies   . Tick bite     Past Surgical History:  Procedure Laterality Date  . CHOLECYSTECTOMY    . SHOULDER ARTHROSCOPY Right 09/09/15   Reattached Rotatar tendon and a ligament  . SHOULDER ARTHROSCOPY Right 12/30/2015   Procedure: RIGHT SHOULDER EXAM UNDER ANESTHESIA MANIPULATION UNDER ANESTHESIA  ARTHROSCOPIC CAPSULAR RELEASE AND SCAR RELEASE ;  Surgeon: Beverely Low, MD;  Location: [facility] OR;  Service: Orthopedics;  Laterality: Right;  . TONSILLECTOMY      OB History    Gravida Para Term Preterm AB Living   SAB TAB Ectopic Multiple Live Births                  Social History   Social History  . Marital status: Single    Spouse name: N/A  . Number of children: N/A  . Years of education: N/A   Social History Main Topics  . Smoking status: Former Smoker    Years: 1.00  . Smokeless tobacco: Never Used     Comment: 1 pack every 2 weeks - none sincwe 08/2015  . Alcohol use 0.0 oz/week     Comment: 1-2x per week/month  . Drug use: No  . Sexual activity: Yes   Other Topics Concern  . None   Social History Narrative   Lives with son in one story home.     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     Also works at [employer].      Family History  Problem Relation Age of Onset  . Hypertension Mother   . Other Mother         hypogylcemia  . Diabetes Father   . Hypertension Father   . Asthma Father   . Diabetes Maternal Grandmother   . Diabetes Paternal Grandmother   . Healthy Son   . Allergic rhinitis Neg Hx   . Angioedema Neg Hx   . Eczema Neg Hx   . Immunodeficiency Neg Hx   . Urticaria Neg Hx      Current Outpatient Prescriptions:  .  albuterol (PROVENTIL HFA;VENTOLIN HFA) 108 (90 Base) MCG/ACT inhaler, Inhale 1-2 puffs into the lungs every 4 (four) hours as needed for wheezing or shortness of breath., Disp: 1 Inhaler, Rfl: 1 .  cetirizine (ZYRTEC) 10 MG tablet, Take one tablet daily for runny nose or drainage., Disp: 30 tablet, Rfl: 11 .  mometasone (NASONEX) 50 MCG/ACT nasal spray, Use 2 sprays in each nostril once daily for stuffy nose or drainage., Disp: 17 g, Rfl: 11 .  montelukast (SINGULAIR) 10 MG tablet, Take 1 tablet (10 mg total) by mouth at bedtime as needed., Disp: 30 tablet, Rfl: 11 .  sulfamethoxazole-trimethoprim (BACTRIM DS) 800-160 MG tablet, Bactrim DS 800 mg-160 mg tablet  Take 1 tablet every 12 hours  by oral route., Disp: , Rfl:  .  methocarbamol (ROBAXIN) 500 MG tablet, Take 1 tablet (500 mg total) by mouth 3 (three) times daily as needed. (Patient not taking: Reported on 05/26/2016), Disp: 60 tablet, Rfl: 1  Review of Systems  Review of Systems  Constitutional: Negative for fever, chills, weight loss, malaise/fatigue and diaphoresis.  HENT: Negative for hearing loss, ear pain, nosebleeds, congestion, sore throat, neck pain, tinnitus and ear discharge.   Eyes: Negative for blurred vision, double vision, photophobia, pain, discharge and redness.  Respiratory: Negative for cough, hemoptysis, sputum production, shortness of breath, wheezing and stridor.   Cardiovascular: Negative for chest pain, palpitations, orthopnea, claudication, leg swelling and PND.  Gastrointestinal: negative for abdominal pain. Negative for heartburn, nausea, vomiting, diarrhea, constipation, blood in  stool and melena.  Genitourinary: Negative for dysuria, urgency, frequency, hematuria and flank pain.  Musculoskeletal: Negative for myalgias, back pain, joint pain and falls.  Skin: Negative for itching and rash.  Neurological: Negative for dizziness, tingling, tremors, sensory change, speech change, focal weakness, seizures, loss of consciousness, weakness and headaches.  Endo/Heme/Allergies: Negative for environmental allergies and polydipsia. Does not bruise/bleed easily.  Psychiatric/Behavioral: Negative for depression, suicidal ideas, hallucinations, memory loss and substance abuse. The patient is not nervous/anxious and does not have insomnia.        Objective:  Blood pressure 124/62, pulse 83, height  (1.676 m), weight 223 lb (101.2 kg), SpO2 97 %.   Physical Exam  Vitals reviewed. Constitutional: She is oriented to person, place, and time. She appears well-developed and well-nourished.  HENT:  Head: Normocephalic and atraumatic.        Right Ear: External ear normal.  Left Ear: External ear normal.  Nose: Nose normal.  Mouth/Throat: Oropharynx is clear and moist.  Eyes: Conjunctivae and EOM are normal. Pupils are equal, round, and reactive to light. Right eye exhibits no discharge. Left eye exhibits no discharge. No scleral icterus.  Neck: Normal range of motion. Neck supple. No tracheal deviation present. No thyromegaly present.  Cardiovascular: Normal rate, regular rhythm, normal heart sounds and intact distal pulses.  Exam reveals no gallop and no friction rub.   No murmur heard. Respiratory: Effort normal and breath sounds normal. No respiratory distress. She has no wheezes. She has no rales. She exhibits no tenderness.  GI: Soft. Bowel sounds are normal. She exhibits no distension and no mass. There is no tenderness. There is no rebound and no guarding.  Genitourinary:  Breasts no masses skin changes or nipple changes bilaterally      Vulva is normal without  lesions Vagina is pink moist without discharge Cervix normal in appearance and pap is done Uterus is normal size shape and contour Adnexa is negative with normal sized ovaries   Musculoskeletal: Normal range of motion. She exhibits no edema and no tenderness.  Neurological: She is alert and oriented to person, place, and time. She has normal reflexes. She displays normal reflexes. No cranial nerve deficit. She exhibits normal muscle tone. Coordination normal.  Skin: Skin is warm and dry. No rash noted. No erythema. No pallor.  Psychiatric: She has a normal mood and affect. Her behavior is normal. Judgment and thought content normal.       Medications Ordered at today's visit: Meds ordered this encounter  Medications  . sulfamethoxazole-trimethoprim (BACTRIM DS) 800-160 MG tablet    Sig: Bactrim DS 800 mg-160 mg tablet  Take 1 tablet every 12 hours by oral route.    Other orders  placed at today's visit: No orders of the defined types were placed in this encounter.     Assessment:    Healthy female exam.    Plan:    yearly 2 years     Return in about 2 years (around 07/21/2019) for yearly, with Dr Despina Hidden.

## 2017-07-22 LAB — CYTOLOGY - PAP
Diagnosis: NEGATIVE
HPV (WINDOPATH): NOT DETECTED

## 2017-10-15 ENCOUNTER — Encounter: Payer: Self-pay | Admitting: Allergy & Immunology

## 2017-10-15 ENCOUNTER — Ambulatory Visit: Payer: BLUE CROSS/BLUE SHIELD | Admitting: Allergy & Immunology

## 2017-10-15 VITALS — BP 116/78 | HR 84 | Resp 20 | Ht 65.0 in | Wt 218.6 lb

## 2017-10-15 DIAGNOSIS — J310 Chronic rhinitis: Secondary | ICD-10-CM

## 2017-10-15 DIAGNOSIS — T781XXD Other adverse food reactions, not elsewhere classified, subsequent encounter: Secondary | ICD-10-CM

## 2017-10-15 DIAGNOSIS — J4521 Mild intermittent asthma with (acute) exacerbation: Secondary | ICD-10-CM

## 2017-10-15 DIAGNOSIS — J4530 Mild persistent asthma, uncomplicated: Secondary | ICD-10-CM

## 2017-10-15 MED ORDER — EPINEPHRINE 0.3 MG/0.3ML IJ SOAJ: 0.3000 mg | Freq: Once | INTRAMUSCULAR | 2 refills | Status: AC

## 2017-10-15 MED ORDER — BECLOMETHASONE DIPROP HFA 80 MCG/ACT IN AERB: INHALATION_SPRAY | RESPIRATORY_TRACT | 5 refills | Status: DC

## 2017-10-15 MED ORDER — CETIRIZINE HCL 10 MG PO TABS: 10.0000 mg | ORAL_TABLET | Freq: Every day | ORAL | 3 refills | Status: DC

## 2017-10-15 NOTE — Progress Notes (Signed)
FOLLOW UP  Date of Service/Encounter:  10/15/17   Assessment:   Mild intermittent asthma without complication  Adverse food reaction (alpha gal sensitivity)  Chronic rhinitis   Asthma Reportables:  Severity: intermittent  Risk: low Control: well controlled  Plan/Recommendations:   1. Intermittent asthma - with acute exacerbation - Lung testing looked terrible today, but it did improve a nebulizer treatment.  - I think you have contracted a viral illness that has flared your asthma. - Continue with albuterol, but you can use up to four puffs every 4-6 hours as needed. - Continue with Singulair 10mg  daily (ok to use as needed since this has worked in the past).  - Start Qvar 80mcg two puffs THREE TIMES DAILY for two weeks until you are feeling better.  - If you are not feeling better over the weekend, start the prednisone pack provided.   2. Adverse food reaction - We will get repeat labs to check on your alpha-gal levels. - We will refill your EpiPen.   3. Chronic rhinitis - Continue with Singulair 10mg  daily as needed. - Continue with cetirizine 10mg  daily as needed.  4. Return in about 1 year (around 10/15/2018).  Subjective:   Kimberly Vance is a 47 y.o. female presenting today for follow up of  Chief Complaint  Patient presents with  . Asthma    Albuterol prn     Kimberly Vance has a history of the following: Patient Active Problem List   Diagnosis Date Noted  . Postmenopausal bleeding 12/04/2013    History obtained from: chart review and patient.  [facility] Primary Care Provider is Patient, No Pcp Per.     Kimberly Vance is a 47 y.o. female presenting for a follow up visit. She was last seen in August 2018. At that time, she had been doing well. She has a history of alpha gal sensitivity with a recent test in 2016, therefore we deferred further testing. EpiPen was up to date. She has a history of chronic rhinitis and I recommended continued treatment with  Singulair, cetirizine, and Nasonex. She has a history of asthma, although it was very mild and she only treated it with Singulair and albuterol as needed. We did not start any controllers since she was completely asymptomatic.   Since the last visit, she has mostly done well. But she is here today because of a recent onset cold like illness with shortness of breath. She was hiking the day after Christmas and then she did fine for a couple of days. 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 She does report a low grade fever as well as chills. She does endorse chest pain and wheezing at night. She has been using her albuterol which does provide some relief transiently She is using one puff three times daily.   She remains on Singulair, which she is using only as needed. Prior to this current illness, she has had no problems that required her using her Singulair. Nasal symptoms under good control. Se does have wood heat at home, which leads to more nose bleeds.   She does not remember the last time that she needed prednisone. She normally does fine without the need of albuterol. Typically her albuterol inhalers expire before she finished them. She has never been on a daily controller medication for her asthma. Otherwise, there have been no changes to her past medical history, surgical history, family history,  or social history.    Review of Systems: a 14-point review of systems is pertinent for what is mentioned in HPI.  Otherwise, all other systems were negative. Constitutional: negative other than that listed in the HPI Eyes: negative other than that listed in the HPI Ears, nose, mouth, throat, and face: negative other than that listed in the HPI Respiratory: negative other than that listed in the HPI Cardiovascular: negative other than that listed in the HPI Gastrointestinal: negative other than that listed in the  HPI Genitourinary: negative other than that listed in the HPI Integument: negative other than that listed in the HPI Hematologic: negative other than that listed in the HPI Musculoskeletal: negative other than that listed in the HPI Neurological: negative other than that listed in the HPI Allergy/Immunologic: negative other than that listed in the HPI    Objective:   Blood pressure 116/78, pulse 84, resp. rate 20, height 5\' 5"  (1.651 m), weight 218 lb 9.6 oz (99.2 kg), SpO2 95 %. Body mass index is 36.38 kg/m.   Physical Exam:  General: Alert, interactive, in no acute distress. Pleasant female.  Eyes: No conjunctival injection bilaterally, no discharge on the right, no discharge on the left and no Horner-Trantas dots present. PERRL bilaterally. EOMI without pain. No photophobia.  Ears: Right OME and Left OME.  Nose/Throat: External nose within normal limits and septum midline. Turbinates edematous with clear discharge. Posterior oropharynx erythematous without cobblestoning in the posterior oropharynx. Tonsils 2+ without exudates.  Tongue without thrush. Adenopathy: no enlarged lymph nodes appreciated in the anterior cervical, occipital, axillary, epitrochlear, inguinal, or popliteal regions. Lungs: Mildly decreased breath sounds with expiratory wheezing bilaterally.Coarse rhonchi throughout. No increased work of breathing. CV: Normal S1/S2. No murmurs. Capillary refill <2 seconds.  Skin: Warm and dry, without lesions or rashes. Neuro:   Grossly intact. No focal deficits appreciated. Responsive to questions.  Diagnostic studies:   Spirometry: results abnormal (FEV1: 2.45/80%, FVC: 2.60/68%, FEV1/FVC: 94%).    Spirometry consistent with possible restrictive disease. Xopenex/Atrovent nebulizer treatment given in clinic with significant improvement in FEV1 and FVC per ATS criteria.  Allergy Studies: none (repeat alpha gal panel collected today)     Malachi Bonds, MD  [facility]

## 2017-10-15 NOTE — Patient Instructions (Addendum)
1. Mild persistent asthma, uncomplicated - Lung testing looked terrible today, but it did improve a nebulizer treatment.  - I think you have contracted a viral illness that has flared your asthma. - Continue with albuterol, but you can use up to four puffs every 4-6 hours as needed. - Continue with Singulair 10mg  daily (ok to use as needed since this has worked in the past).  - Start Qvar 80mcg two puffs THREE TIMES DAILY for two weeks until you are feeling better.  - If you are not feeling better over the weekend, start the prednisone pack provided.   2. Adverse food reaction - We will get repeat labs to check on your alpha-gal levels. - We will refill your EpiPen.   3. Chronic rhinitis - Continue with Singulair 10mg  daily as needed. - Continue with cetirizine 10mg  daily as needed.  4. Return in about 1 year (around 10/15/2018).   Please inform us of any Emergency Department visits, hospitalizations, or changes in symptoms. Call us before going to the ED for breathing or allergy symptoms since we might be able to fit you in for a sick visit. Feel free to contact us anytime with any questions, problems, or concerns.  It was a pleasure to see you again today! Happy New Year!   Websites that have reliable patient information: 1. American Academy of Asthma, Allergy, and Immunology: www.aaaai.org 2. Food Allergy Research and Education (FARE): foodallergy.org 3. Mothers of Asthmatics: http://www.asthmacommunitynetwork.org 4. American College of Allergy, Asthma, and Immunology: www.acaai.org

## 2017-10-21 ENCOUNTER — Other Ambulatory Visit: Payer: Self-pay

## 2017-10-21 LAB — ALPHA-GAL PANEL
ALPHA GAL IGE: 7.08 kU/L — AB (ref <0.10)
Beef (Bos spp) IgE: 4.64 kU/L — ABNORMAL HIGH (ref <0.35)
Class Interpretation: 2
Class Interpretation: 3
Lamb/Mutton (Ovis spp) IgE: 3.01 kU/L — ABNORMAL HIGH (ref <0.35)
PORK (SUS SPP) IGE: 3.45 kU/L — AB (ref <0.35)
PORK CLASS INTERPRETATION: 2

## 2017-10-21 MED ORDER — MONTELUKAST SODIUM 10 MG PO TABS: 10.0000 mg | ORAL_TABLET | Freq: Every evening | ORAL | 5 refills | Status: DC | PRN

## 2017-10-21 MED ORDER — ALBUTEROL SULFATE HFA 108 (90 BASE) MCG/ACT IN AERS: 1.0000 | INHALATION_SPRAY | RESPIRATORY_TRACT | 1 refills | Status: DC | PRN

## 2018-08-22 ENCOUNTER — Other Ambulatory Visit: Payer: Self-pay | Admitting: Obstetrics & Gynecology

## 2018-08-22 ENCOUNTER — Ambulatory Visit (HOSPITAL_COMMUNITY): Payer: BLUE CROSS/BLUE SHIELD

## 2018-08-22 DIAGNOSIS — Z1231 Encounter for screening mammogram for malignant neoplasm of breast: Secondary | ICD-10-CM

## 2018-08-31 ENCOUNTER — Ambulatory Visit (HOSPITAL_COMMUNITY)
Admission: RE | Admit: 2018-08-31 | Discharge: 2018-08-31 | Disposition: A | Discharge status: Home / Self Care | Payer: BLUE CROSS/BLUE SHIELD | Source: Ambulatory Visit | Attending: Obstetrics & Gynecology | Admitting: Obstetrics & Gynecology

## 2018-08-31 DIAGNOSIS — Z1231 Encounter for screening mammogram for malignant neoplasm of breast: Secondary | ICD-10-CM | POA: Diagnosis present

## 2018-09-02 ENCOUNTER — Telehealth: Payer: Self-pay | Admitting: *Deleted

## 2018-09-02 NOTE — Telephone Encounter (Signed)
Attempted to call pt and notify her of normal mammogram but no answer

## 2018-09-05 ENCOUNTER — Telehealth: Payer: Self-pay | Admitting: *Deleted

## 2018-09-05 NOTE — Telephone Encounter (Signed)
LMOVM that mammogram was normal.  

## 2019-01-02 ENCOUNTER — Telehealth: Payer: Self-pay | Admitting: Allergy & Immunology

## 2019-01-02 MED ORDER — MONTELUKAST SODIUM 10 MG PO TABS: 10.0000 mg | ORAL_TABLET | Freq: Every evening | ORAL | 1 refills | Status: DC | PRN

## 2019-01-02 MED ORDER — ALBUTEROL SULFATE HFA 108 (90 BASE) MCG/ACT IN AERS: 1.0000 | INHALATION_SPRAY | RESPIRATORY_TRACT | 1 refills | Status: DC | PRN

## 2019-01-02 MED ORDER — CETIRIZINE HCL 10 MG PO TABS: 10.0000 mg | ORAL_TABLET | Freq: Every day | ORAL | 1 refills | Status: DC

## 2019-01-02 MED ORDER — AUVI-Q 0.3 MG/0.3ML IJ SOAJ: 0.3000 mg | INTRAMUSCULAR | 1 refills | Status: DC | PRN

## 2019-01-02 NOTE — Telephone Encounter (Signed)
Patient is requesting refills. She called pharmacy and they told her to call us. Auvi-Q (runs out end of month), montelukast and cetirizine. [place], [place]

## 2019-01-02 NOTE — Telephone Encounter (Signed)
Prescription have been sent in. Auvi-Q sent to [facility] pharmacy as well.

## 2019-01-23 NOTE — Telephone Encounter (Signed)
Prior authorization requested for Auvi-Q 0.3 mg. This has been approved and faxed to pharmacy.

## 2019-06-27 ENCOUNTER — Telehealth: Payer: Self-pay

## 2019-06-27 ENCOUNTER — Ambulatory Visit: Payer: BLUE CROSS/BLUE SHIELD | Admitting: Allergy & Immunology

## 2019-06-27 NOTE — Telephone Encounter (Signed)
Call to patient and advised to keep appointment on Thursday.  Asked patient to sign up for my chart so she can send pictures of the bug bites so that Dr Ernst Bowler can see them before the appointment in case he has any other recommendations prior to seeing her.  Pt agreed and will send over pictures and will also keep her appointment on Thursday.

## 2019-06-27 NOTE — Telephone Encounter (Signed)
As of this moment, no pictures have been downloaded to my chart although link sent.  She did sign up for my chart.

## 2019-06-27 NOTE — Telephone Encounter (Signed)
Patient is calling to see if Dr Ernst Bowler can recommend what she can do for all these bug bites she has. Patient was down to come in today for a visit , but her dog is having a stroke and she is having to take the dog to the vet right now.   Please Advise.

## 2019-06-28 NOTE — Telephone Encounter (Signed)
I hope her dog is OK.   Salvatore Marvel, MD [facility] [place]

## 2019-06-29 ENCOUNTER — Other Ambulatory Visit: Payer: Self-pay

## 2019-06-29 ENCOUNTER — Ambulatory Visit (INDEPENDENT_AMBULATORY_CARE_PROVIDER_SITE_OTHER): Payer: BC Managed Care – PPO | Admitting: Allergy & Immunology

## 2019-06-29 ENCOUNTER — Encounter: Payer: Self-pay | Admitting: Allergy & Immunology

## 2019-06-29 VITALS — BP 108/60 | HR 56 | Temp 98.3°F | Resp 18 | Ht 64.0 in | Wt 161.2 lb

## 2019-06-29 DIAGNOSIS — J4521 Mild intermittent asthma with (acute) exacerbation: Secondary | ICD-10-CM | POA: Diagnosis not present

## 2019-06-29 DIAGNOSIS — T781XXD Other adverse food reactions, not elsewhere classified, subsequent encounter: Secondary | ICD-10-CM

## 2019-06-29 DIAGNOSIS — W57XXXA Bitten or stung by nonvenomous insect and other nonvenomous arthropods, initial encounter: Secondary | ICD-10-CM

## 2019-06-29 MED ORDER — DOXYCYCLINE MONOHYDRATE 100 MG PO CAPS: 100.0000 mg | ORAL_CAPSULE | Freq: Two times a day (BID) | ORAL | 0 refills | Status: DC

## 2019-06-29 MED ORDER — MONTELUKAST SODIUM 10 MG PO TABS: 10.0000 mg | ORAL_TABLET | Freq: Every evening | ORAL | 5 refills | Status: DC | PRN

## 2019-06-29 MED ORDER — EUCRISA 2 % EX OINT: 1.0000 "application " | TOPICAL_OINTMENT | Freq: Two times a day (BID) | CUTANEOUS | 5 refills | Status: DC

## 2019-06-29 MED ORDER — ALBUTEROL SULFATE HFA 108 (90 BASE) MCG/ACT IN AERS: 1.0000 | INHALATION_SPRAY | RESPIRATORY_TRACT | 1 refills | Status: DC | PRN

## 2019-06-29 MED ORDER — QVAR REDIHALER 80 MCG/ACT IN AERB: INHALATION_SPRAY | RESPIRATORY_TRACT | 5 refills | Status: DC

## 2019-06-29 MED ORDER — CETIRIZINE HCL 10 MG PO TABS: 10.0000 mg | ORAL_TABLET | Freq: Every day | ORAL | 5 refills | Status: AC

## 2019-06-29 NOTE — Progress Notes (Signed)
FOLLOW UP  Date of Service/Encounter:  06/29/19   Assessment:   Mild intermittent asthma without complication  Adverse food reaction (alpha gal sensitivity)  Chronic rhinitis  Multiple tick bites (attached for unknown duration)  Plan/Recommendations:   1. Mild persistent asthma, uncomplicated - Lung testing looked great today.  - We will not make any medication changes today. - Continue with albuterol 4 puffs every 4-6 hours as needed. - Add on Qvar two puffs twice daily when you are sick for 1-2 weeks.   2. Adverse food reaction - Continue to avoid mammalian meat.  - Repeat alpha gal levels.  - We will refill your EpiPen.    3. Chronic rhinitis - Continue with Singulair 10mg  daily as needed. - Continue with cetirizine 10mg  daily as needed.  4. Tick bites - Start doxycyline 100mg  twice daily for seven days. - Add on Eucrisa twice daily to help with the itching.   5. Return in about 1 year (around 06/28/2020). This can be an in-person, a virtual Webex or a telephone follow up visit.  Subjective:   Kimberly Vance is a 48 y.o. female presenting today for follow up of  Chief Complaint  Patient presents with  . Follow-up  . Pruritus    Kimberly Vance has a history of the following: Patient Active Problem List   Diagnosis Date Noted  . Postmenopausal bleeding 12/04/2013    History obtained from: chart review and patient.  Kimberly Vance is a 48 y.o. female presenting for a follow up visit.  She was last seen in January 2019.  At that time, her lung testing looked awful but it improved with a nebulizer treatment.  We continued with albuterol and Singulair 10 mg daily.  We started Qvar 80 mcg 2 puffs 3 times daily for couple weeks during respiratory flares.  For her adverse food reaction, we did get a repeat alpha gal level.  We continued the Singulair and cetirizine for her rhinitis.  Her alpha gal panel continue to be elevated.  Since last visit, she has mostly done well.  She presents today for multiple insect bites, which from the story seem to be tick bites. She [place]. She developed multiple tick bites on her legs as well as her arms. She is unsure how long they have been attached. She notes that there has been no characteristic rash. She has had no fevers or arthritis. These bites are certainly pruritic, however, and she has been treating these with topical Benadryl. She has not been using her antihistamines as well.   She continues to avoid red meats. She is not really interested in rechecking today but is open to it. She does not really miss red meat at all and clearly she is aware that she gets bit often and would likely become re-sensitized in no time at all.  Kimberly Vance's asthma has been well controlled. She has not required rescue medication, experienced nocturnal awakenings due to lower respiratory symptoms, nor have activities of daily living been limited. She has required no Emergency Department or Urgent Care visits for her asthma. She has required zero courses of systemic steroids for asthma exacerbations since the last visit. ACT score today is 24, indicating excellent asthma symptom control. She has not used her albuterol in quite some time, but she does use her montelukast daily.   Her mother recently had a stroke and is hospitalized. 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   Otherwise, there have been no changes to her past medical history, surgical history, family history, or social history.    Review of Systems  Constitutional: Negative.  Negative for chills, fever, malaise/fatigue and weight loss.  HENT: Negative.  Negative for congestion, ear discharge and ear pain.   Eyes: Negative for pain, discharge and redness.  Respiratory: Negative for cough,  sputum production, shortness of breath and wheezing.   Cardiovascular: Negative.  Negative for chest pain and palpitations.  Gastrointestinal: Negative for abdominal pain, heartburn, nausea and vomiting.  Skin: Positive for itching. Negative for rash.  Neurological: Negative for dizziness and headaches.  Endo/Heme/Allergies: Negative for environmental allergies. Does not bruise/bleed easily.       Objective:   Blood pressure 108/60, pulse (!) 56, temperature 98.3 F (36.8 C), temperature source Temporal, resp. rate 18, height 5\' 4"  (1.626 m), weight 161 lb 3.2 oz (73.1 kg), SpO2 96 %. Body mass index is 27.67 kg/m.   Physical Exam:  Physical Exam  Constitutional: She appears well-developed.  Pleasant female.  HENT:  Head: Normocephalic and atraumatic.  Right Ear: Tympanic membrane, external ear and ear canal normal.  Left Ear: Tympanic membrane and ear canal normal.  Nose: No mucosal edema, rhinorrhea, nasal deformity or septal deviation. No epistaxis. Right sinus exhibits no maxillary sinus tenderness and no frontal sinus tenderness. Left sinus exhibits no maxillary sinus tenderness and no frontal sinus tenderness.  Mouth/Throat: Uvula is midline and oropharynx is clear and moist. Mucous membranes are not pale and not dry.  Eyes: Pupils are equal, round, and reactive to light. Conjunctivae and EOM are normal. Right eye exhibits no chemosis and no discharge. Left eye exhibits no chemosis and no discharge. Right conjunctiva is not injected. Left conjunctiva is not injected.  Cardiovascular: Normal rate, regular rhythm and normal heart sounds.  Respiratory: Effort normal and breath sounds normal. No accessory muscle usage. No tachypnea. No respiratory distress. She has no wheezes. She has no rhonchi. She has no rales. She exhibits no tenderness.  Lymphadenopathy:    She has no cervical adenopathy.  Neurological: She is alert.  Skin: No abrasion, no petechiae and no rash noted. Rash  is not papular, not vesicular and not urticarial. No erythema. No pallor.  Multiple papules over her bilateral arms as well as some on the legs.  There are excoriation marks present.  There is no discharge honey crusting.  There is no erythema migrans.  Psychiatric: She has a normal mood and affect.     Diagnostic studies:    Spirometry: results normal (FEV1: 3.10/110%, FVC: 3.46/97%, FEV1/FVC: 90%).    Spirometry consistent with normal pattern.   Allergy Studies: none      Salvatore Marvel, MD  [facility] [place]

## 2019-06-29 NOTE — Patient Instructions (Addendum)
1. Mild persistent asthma, uncomplicated - Lung testing looked great today.  - We will not make any medication changes today. - Continue with albuterol 4 puffs every 4-6 hours as needed. - Add on Qvar two puffs twice daily when you are sick for 1-2 weeks.   2. Adverse food reaction - Continue to avoid mammalian meat.  - Repeat alpha gal levels.  - We will refill your EpiPen.    3. Chronic rhinitis - Continue with Singulair 10mg  daily as needed. - Continue with cetirizine 10mg  daily as needed.  4. Tick bites - Start doxycyline 100mg  twice daily for seven days. - Add on Eucrisa twice daily to help with the itching.   5. Return in about 1 year (around 06/28/2020). This can be an in-person, a virtual Webex or a telephone follow up visit.   Please inform us of any Emergency Department visits, hospitalizations, or changes in symptoms. Call us before going to the ED for breathing or allergy symptoms since we might be able to fit you in for a sick visit. Feel free to contact us anytime with any questions, problems, or concerns.  It was a pleasure to see you again today!  Websites that have reliable patient information: 1. American Academy of Asthma, Allergy, and Immunology: www.aaaai.org 2. Food Allergy Research and Education (FARE): foodallergy.org 3. Mothers of Asthmatics: http://www.asthmacommunitynetwork.org 4. American College of Allergy, Asthma, and Immunology: www.acaai.org  "Like" [place] on Facebook and Instagram for our latest updates!      Make sure you are registered to vote! If you have moved or changed any of your contact information, you will need to get this updated before voting!  In some cases, you MAY be able to register to vote online: [email or web address]    Voter ID laws are NOT going into effect for the General Election in November 2020! DO NOT let this stop you from exercising your right to vote!   Absentee voting is the SAFEST way to  vote during the coronavirus pandemic!   Download and print an absentee ballot request form at [email or web address] or you can scan the QR code below with your smart phone:      More information on absentee ballots can be found here: [email or web address]  [place] VOTING SITES have been established! You can register to vote and cast your vote on the same day at these locations. See this site for more information on what you need to register to vote: [email or web address]

## 2019-06-30 ENCOUNTER — Encounter: Payer: Self-pay | Admitting: Allergy & Immunology

## 2019-07-04 LAB — ALPHA-GAL PANEL
Alpha Gal IgE*: 15.9 kU/L — ABNORMAL HIGH (ref <0.10)
Beef (Bos spp) IgE: 12.4 kU/L — ABNORMAL HIGH (ref <0.35)
Class Interpretation: 3
Class Interpretation: 3
Class Interpretation: 3
Lamb/Mutton (Ovis spp) IgE: 8.04 kU/L — ABNORMAL HIGH (ref <0.35)
Pork (Sus spp) IgE: 8.62 kU/L — ABNORMAL HIGH (ref <0.35)

## 2019-07-18 ENCOUNTER — Other Ambulatory Visit (HOSPITAL_COMMUNITY): Payer: Self-pay | Admitting: Obstetrics & Gynecology

## 2019-07-18 DIAGNOSIS — Z1231 Encounter for screening mammogram for malignant neoplasm of breast: Secondary | ICD-10-CM

## 2019-09-04 ENCOUNTER — Other Ambulatory Visit: Payer: Self-pay

## 2019-09-04 ENCOUNTER — Ambulatory Visit (HOSPITAL_COMMUNITY)
Admission: RE | Admit: 2019-09-04 | Discharge: 2019-09-04 | Disposition: A | Discharge status: Home / Self Care | Payer: BLUE CROSS/BLUE SHIELD | Source: Ambulatory Visit | Attending: Obstetrics & Gynecology | Admitting: Obstetrics & Gynecology

## 2019-09-04 DIAGNOSIS — Z1231 Encounter for screening mammogram for malignant neoplasm of breast: Secondary | ICD-10-CM | POA: Diagnosis not present

## 2019-11-27 ENCOUNTER — Telehealth: Payer: Self-pay | Admitting: Allergy & Immunology

## 2019-11-27 ENCOUNTER — Other Ambulatory Visit: Payer: Self-pay | Admitting: *Deleted

## 2019-11-27 MED ORDER — AUVI-Q 0.3 MG/0.3ML IJ SOAJ: 0.3000 mg | INTRAMUSCULAR | 1 refills | Status: DC | PRN

## 2019-11-27 NOTE — Telephone Encounter (Signed)
Patient called her pharmacy and they were going to send [place] for prior auth.. she needs a Auvi_Q refilled. [phone]

## 2019-11-27 NOTE — Telephone Encounter (Signed)
Kimberly Vance has been sent to pharmacy. Called patient and advised. Patient verbalized understanding.

## 2019-12-25 ENCOUNTER — Other Ambulatory Visit: Payer: Self-pay | Admitting: *Deleted

## 2020-08-07 ENCOUNTER — Other Ambulatory Visit (HOSPITAL_COMMUNITY): Payer: Self-pay | Admitting: Obstetrics & Gynecology

## 2020-08-07 DIAGNOSIS — Z1231 Encounter for screening mammogram for malignant neoplasm of breast: Secondary | ICD-10-CM

## 2020-08-27 NOTE — Patient Instructions (Addendum)
Mild persistent asthma Continue albuterol 2 puffs every 4 hours as needed for cough, wheeze, tightness in chest, or shortness of breath. Also, may use 2 puffs 5-15 minutes prior to exercise to help prevent cough and wheeze For asthma flares/respiratory sickness begin Qvar 80 mcg 2 puffs twice a day with spacer for 1-2 weeks  Start doxycycline 100 mg 1 tablet twice a day for 7 days Adverse food reaction Avoid mammalian meat. In case of an allergic reaction, give Benadryl 4 teaspoonfuls every 4 hours, and if life-threatening symptoms occur, inject with AuviQ 0.3 mg. Repeat alpha gal levels. We will call you with results. Refill epinephrine auto injector  Chronic rhinitis Re-start Singulair 10 mg once a day Continue cetirizine 10 mg once a day as needed for runny nose  May use saline nasal gel such as [place] or [employer] Huger Med to help with nasal dryness. Epistaxis Pinch both nostrils while leaning forward for at least 5 minutes before checking to see if the bleeding has stopped. If bleeding is not controlled within 5-10 minutes apply a cotton ball soaked with oxymetazoline (Afrin) to the bleeding nostril for a few seconds.  If the problem persists or worsens a referral to ENT for further evaluation may be necessary.   Please let us know if this treatment plan is not working well for you. Schedule a follow up appointment in 3 months

## 2020-08-28 ENCOUNTER — Other Ambulatory Visit: Payer: Self-pay

## 2020-08-28 ENCOUNTER — Ambulatory Visit: Payer: BC Managed Care – PPO | Admitting: Family

## 2020-08-28 ENCOUNTER — Encounter: Payer: Self-pay | Admitting: Family

## 2020-08-28 VITALS — BP 110/72 | HR 68 | Temp 98.7°F | Resp 18 | Ht 65.0 in | Wt 165.0 lb

## 2020-08-28 DIAGNOSIS — T781XXD Other adverse food reactions, not elsewhere classified, subsequent encounter: Secondary | ICD-10-CM | POA: Diagnosis not present

## 2020-08-28 DIAGNOSIS — R040 Epistaxis: Secondary | ICD-10-CM | POA: Diagnosis not present

## 2020-08-28 DIAGNOSIS — J310 Chronic rhinitis: Secondary | ICD-10-CM

## 2020-08-28 DIAGNOSIS — J4521 Mild intermittent asthma with (acute) exacerbation: Secondary | ICD-10-CM | POA: Diagnosis not present

## 2020-08-28 MED ORDER — AUVI-Q 0.3 MG/0.3ML IJ SOAJ: 0.3000 mg | INTRAMUSCULAR | 1 refills | Status: AC | PRN

## 2020-08-28 MED ORDER — ALBUTEROL SULFATE HFA 108 (90 BASE) MCG/ACT IN AERS: 1.0000 | INHALATION_SPRAY | RESPIRATORY_TRACT | 1 refills | Status: AC | PRN

## 2020-08-28 MED ORDER — DOXYCYCLINE MONOHYDRATE 100 MG PO CAPS: 100.0000 mg | ORAL_CAPSULE | Freq: Two times a day (BID) | ORAL | 0 refills | Status: DC

## 2020-08-28 MED ORDER — MONTELUKAST SODIUM 10 MG PO TABS: 10.0000 mg | ORAL_TABLET | Freq: Every evening | ORAL | 5 refills | Status: AC | PRN

## 2020-08-28 MED ORDER — QVAR REDIHALER 80 MCG/ACT IN AERB: INHALATION_SPRAY | RESPIRATORY_TRACT | 5 refills | Status: AC

## 2020-08-28 NOTE — Progress Notes (Addendum)
[address] [place] [address] Dept: [phone]  FOLLOW UP NOTE  Patient ID: Kimberly Vance, female    DOB: [birth year]  Age: 49 y.o. MRN: [id number] Date of Office Visit: 08/28/2020  Assessment  Chief Complaint: Allergies (scratchy throat, productive cough with yellow sputum, ear pressure, some runny nose, ears are itching some as well. started last Thursday. )  HPI Kimberly Vance is a 49 year old female who presents today for an acute visit.  She was last seen on June 29, 2019 by Dr. Dellis Anes for mild persistent asthma, adverse food reaction, chronic rhinitis  Mild persistent asthma is reported as not well controlled. She reports that her albuterol ran out, but prior to that she had only needed it once a month.  She also no longer has Qvar.  She reports since this past Thursday she has had a productive cough with whitish-yellow sputum, wheezing last Thursday and Friday, and some shortness of breath. She denies any tightness in her chest and nocturnal awakenings. Since her last office visit she has not required any systemic steroids due to breathing problems or made any trips to the emergency room or urgent care due to breathing problems.  She continues to avoid mammalian meat without any accidental ingestion or use of her epinephrine autoinjector.  She would like to repeat lab work to follow-up on her lab values.  Chronic rhinitis is reported as not well controlled.  She reports clear rhinorrhea and postnasal drip due to the nosebleeds yesterday and denies sinus tenderness and nasal congestion.  She has been out of Singulair, but when she does use it she will only use as needed because it makes her extremely sleepy. She will also take cetirizine 10 mg as needed.  She reports that antihistamines if used too frequently will cause her nostrils to dry out and bleed.  It will also cause headaches.  She reports yesterday that she had a couple of nosebleeds.  She has not had any  nose bleeds today.  She reports on August 30 that she slipped and fell in the tub and broke her ribs.   Drug Allergies:  Allergies  Allergen Reactions  . 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    Joyce Copa [Fexofenadine] Rash  . Erythromycin Itching    Review of Systems: Review of Systems  Constitutional: Negative for chills and fever.  HENT:       Reports epistaxis yesterday, clear rhinorrhea, and itchy throat. Also reports PND due to epistaxis  Eyes:       Denies itchy watery eyes  Respiratory: Positive for cough, shortness of breath and wheezing.   Cardiovascular: Negative for chest pain and palpitations.  Gastrointestinal: Negative for abdominal pain and heartburn.  Genitourinary: Negative for dysuria.  Skin: Positive for itching.       Reports itching on bottom of left foot  Neurological: Positive for headaches.       Reports headache due to Zyrtec  Endo/Heme/Allergies: Negative for environmental allergies.    Physical Exam: BP 110/72 (BP Location: Left Arm, Patient Position: Sitting, Cuff Size: Normal)   Pulse 68   Temp 98.7 F (37.1 C) (Temporal)   Resp 18   Ht 5\' 5"  (1.651 m)   Wt 165 lb (74.8 kg)   SpO2 97%   BMI 27.46 kg/m  Physical Exam Constitutional:      Appearance: Normal appearance.  HENT:     Head: Normocephalic and atraumatic.     Comments: Pharynx normal. Eyes normal. Ears normal. Nose: left nostril moderately edematous with no blood noted. Right nostril normal    Right Ear: Tympanic membrane, ear canal and external ear normal.     Left Ear: Tympanic membrane, ear canal and external ear normal.     Mouth/Throat:     Mouth: Mucous membranes are moist.     Pharynx: Oropharynx is clear.  Eyes:     Conjunctiva/sclera: Conjunctivae normal.    Cardiovascular:     Rate and Rhythm: Regular rhythm.     Heart sounds: Normal heart sounds.  Pulmonary:     Effort: Pulmonary effort is normal.     Breath sounds: Normal breath sounds.     Comments: Lungs clear to auscultation Musculoskeletal:     Cervical back: Neck supple.  Skin:    General: Skin is warm.     Comments: Reports itching bottom of left foot- no erythema or rash noted  Neurological:     Mental Status: She is alert and oriented to person, place, and time.  Psychiatric:        Mood and Affect: Mood normal.        Behavior: Behavior normal.        Thought Content: Thought content normal.        Judgment: Judgment normal.     Diagnostics: FVC 3.32 L, FEV1 3.02 L.  Predicted FVC 3.64 L, FEV1 2.87 L.  Spirometry indicates normal ventilatory function.  Assessment and Plan: 1. Mild intermittent asthma with acute exacerbation   2. Chronic rhinitis   3. Adverse food reaction, subsequent encounter   4. Epistaxis     Meds ordered this encounter  Medications  . albuterol (VENTOLIN HFA) 108 (90 Base) MCG/ACT inhaler    Sig: Inhale 1-2 puffs into the lungs every 4 (four) hours as needed for wheezing or shortness of breath.    Dispense:  18 g    Refill:  1  . beclomethasone (QVAR REDIHALER) 80 MCG/ACT inhaler    Sig: 2 puffs twice daily to prevent coughing or wheezing.    Dispense:  10.6 g    Refill:  5  . montelukast (SINGULAIR) 10 MG tablet    Sig: Take 1 tablet (10 mg total) by mouth at bedtime as needed.    Dispense:  30 tablet    Refill:  5  . doxycycline (MONODOX) 100 MG capsule    Sig: Take 1 capsule (100 mg total) by mouth 2 (two) times daily.    Dispense:  14 capsule    Refill:  0  . AUVI-Q 0.3 MG/0.3ML SOAJ injection    Sig: Inject 0.3 mg into the muscle as needed for anaphylaxis.    Dispense:  2 each    Refill:  1    Please call [phone] for delivery.    Patient Instructions  Mild persistent asthma Continue albuterol 2 puffs every 4 hours as  needed for cough, wheeze, tightness in chest, or shortness of breath. Also, may use 2 puffs 5-15 minutes prior to exercise to help prevent cough and wheeze For asthma flares/respiratory sickness begin Qvar 80 mcg 2 puffs twice a day with spacer for 1-2 weeks  Start doxycycline 100 mg 1 tablet twice a day for 7 days Adverse food reaction Avoid mammalian meat. In case of an allergic reaction, give Benadryl 4 teaspoonfuls every  4 hours, and if life-threatening symptoms occur, inject with AuviQ 0.3 mg. Repeat alpha gal levels. We will call you with results. Refill epinephrine auto injector  Chronic rhinitis Re-start Singulair 10 mg once a day Continue cetirizine 10 mg once a day as needed for runny nose  May use saline nasal gel such as Ayr or Lloyd Huger Med to help with nasal dryness. Epistaxis Pinch both nostrils while leaning forward for at least 5 minutes before checking to see if the bleeding has stopped. If bleeding is not controlled within 5-10 minutes apply a cotton ball soaked with oxymetazoline (Afrin) to the bleeding nostril for a few seconds.  If the problem persists or worsens a referral to ENT for further evaluation may be necessary.   Please let us know if this treatment plan is not working well for you. Schedule a follow up appointment in 3 months     Return in about 3 months (around 11/28/2020), or if symptoms worsen or fail to improve.    Thank you for the opportunity to care for this patient.  Please do not hesitate to contact me with questions.  Nehemiah Settle, FNP [facility]

## 2020-08-29 NOTE — Addendum Note (Signed)
Addended by: Robet Leu A on: 08/29/2020 11:32 AM   Modules accepted: Orders

## 2020-09-09 ENCOUNTER — Ambulatory Visit (HOSPITAL_COMMUNITY)
Admission: RE | Admit: 2020-09-09 | Discharge: 2020-09-09 | Disposition: A | Discharge status: Home / Self Care | Payer: BC Managed Care – PPO | Source: Ambulatory Visit | Attending: Obstetrics & Gynecology | Admitting: Obstetrics & Gynecology

## 2020-09-09 ENCOUNTER — Other Ambulatory Visit: Payer: Self-pay

## 2020-09-09 DIAGNOSIS — Z1231 Encounter for screening mammogram for malignant neoplasm of breast: Secondary | ICD-10-CM | POA: Diagnosis present

## 2020-09-14 LAB — ALPHA-GAL PANEL
Alpha Gal IgE*: 13.1 kU/L — ABNORMAL HIGH (ref <0.10)
Beef (Bos spp) IgE: 9.81 kU/L — ABNORMAL HIGH (ref <0.35)
Class Interpretation: 3
Class Interpretation: 3
Class Interpretation: 3
Lamb/Mutton (Ovis spp) IgE: 6.4 kU/L — ABNORMAL HIGH (ref <0.35)
Pork (Sus spp) IgE: 6.57 kU/L — ABNORMAL HIGH (ref <0.35)

## 2020-09-18 NOTE — Progress Notes (Signed)
Please let the patient know that her alpha gal panel is still elevated. Please continue to avoid all mammalian meat. Make sure her epinepherine auto-injector is up to date. Thank you!

## 2020-11-29 ENCOUNTER — Ambulatory Visit: Payer: BC Managed Care – PPO | Admitting: Allergy & Immunology

## 2021-03-05 LAB — EXTERNAL GENERIC LAB PROCEDURE: COLOGUARD: NEGATIVE

## 2021-03-05 LAB — COLOGUARD: COLOGUARD: NEGATIVE

## 2021-08-29 ENCOUNTER — Other Ambulatory Visit (HOSPITAL_COMMUNITY): Payer: Self-pay | Admitting: Obstetrics & Gynecology

## 2021-08-29 ENCOUNTER — Other Ambulatory Visit (HOSPITAL_COMMUNITY): Payer: Self-pay

## 2021-08-29 ENCOUNTER — Other Ambulatory Visit (HOSPITAL_COMMUNITY): Payer: Self-pay | Admitting: Family Medicine

## 2021-08-29 DIAGNOSIS — Z1231 Encounter for screening mammogram for malignant neoplasm of breast: Secondary | ICD-10-CM

## 2021-09-15 ENCOUNTER — Ambulatory Visit (HOSPITAL_COMMUNITY)
Admission: RE | Admit: 2021-09-15 | Discharge: 2021-09-15 | Disposition: A | Discharge status: Home / Self Care | Payer: BC Managed Care – PPO | Source: Ambulatory Visit | Attending: Family Medicine | Admitting: Family Medicine

## 2021-09-15 ENCOUNTER — Other Ambulatory Visit: Payer: Self-pay

## 2021-09-15 DIAGNOSIS — Z1231 Encounter for screening mammogram for malignant neoplasm of breast: Secondary | ICD-10-CM | POA: Diagnosis present

## 2021-11-17 ENCOUNTER — Other Ambulatory Visit: Payer: Self-pay | Admitting: Family

## 2021-11-17 NOTE — Telephone Encounter (Signed)
She has not been seen since 08/28/20. Please have her schedule a follow up appointment.

## 2021-11-30 ENCOUNTER — Other Ambulatory Visit: Payer: Self-pay | Admitting: Allergy & Immunology

## 2022-07-30 IMAGING — MG MM DIGITAL SCREENING BILAT W/ TOMO AND CAD
8 series · 8 of 24 positions shown · non-contrast
Comparison: Previous exam(s).

CLINICAL DATA: Screening.

EXAM:
DIGITAL SCREENING BILATERAL MAMMOGRAM WITH TOMOSYNTHESIS AND CAD
TECHNIQUE: Bilateral screening digital craniocaudal and mediolateral oblique
mammograms were obtained. Bilateral screening digital breast
tomosynthesis was performed. The images were evaluated with
computer-aided detection.

[L MLO synth-2D]
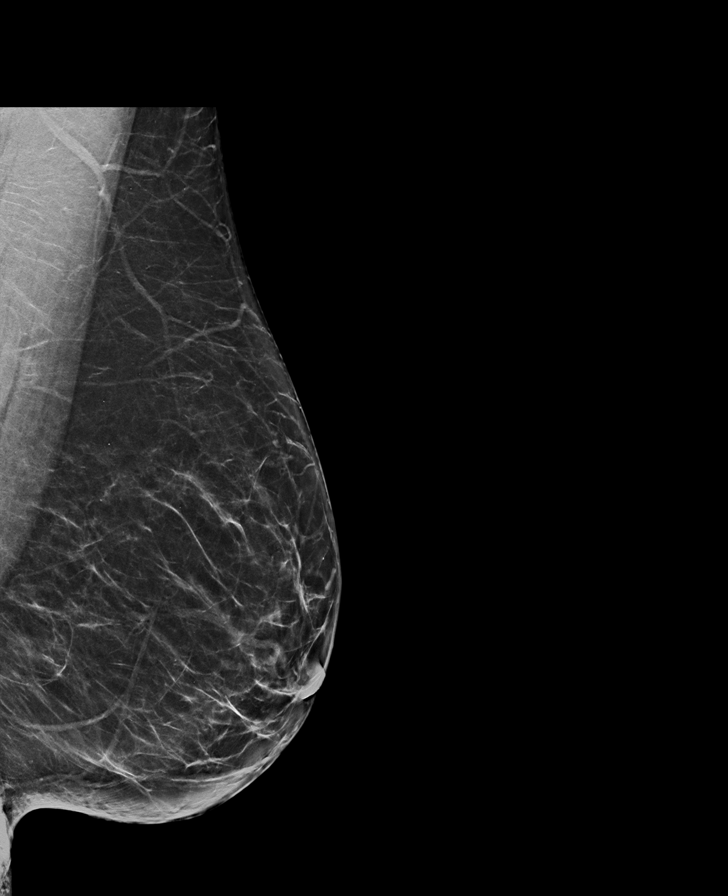

[R CC synth-2D]
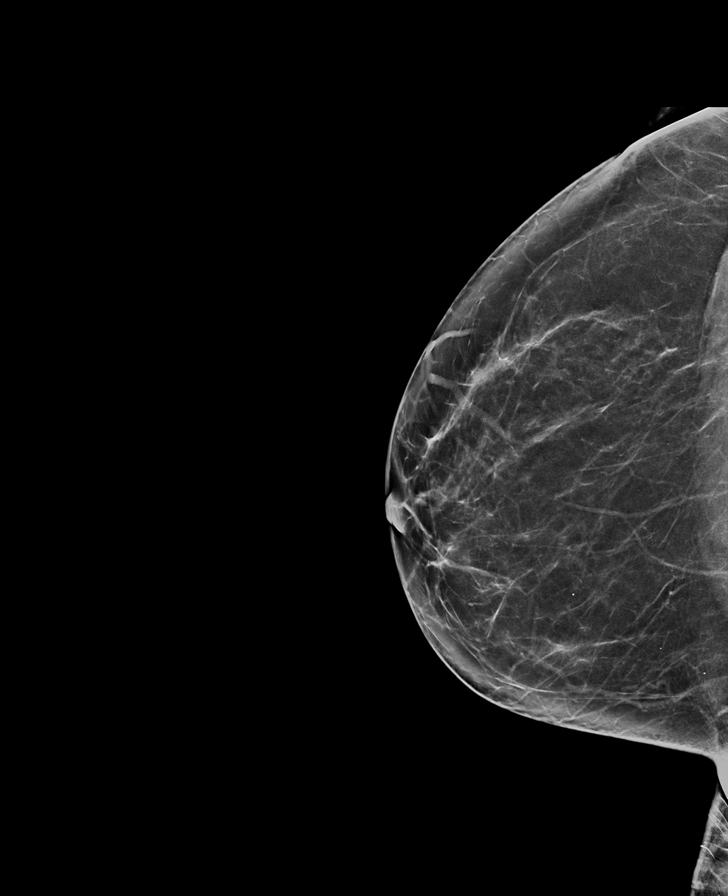

[R MLO synth-2D]
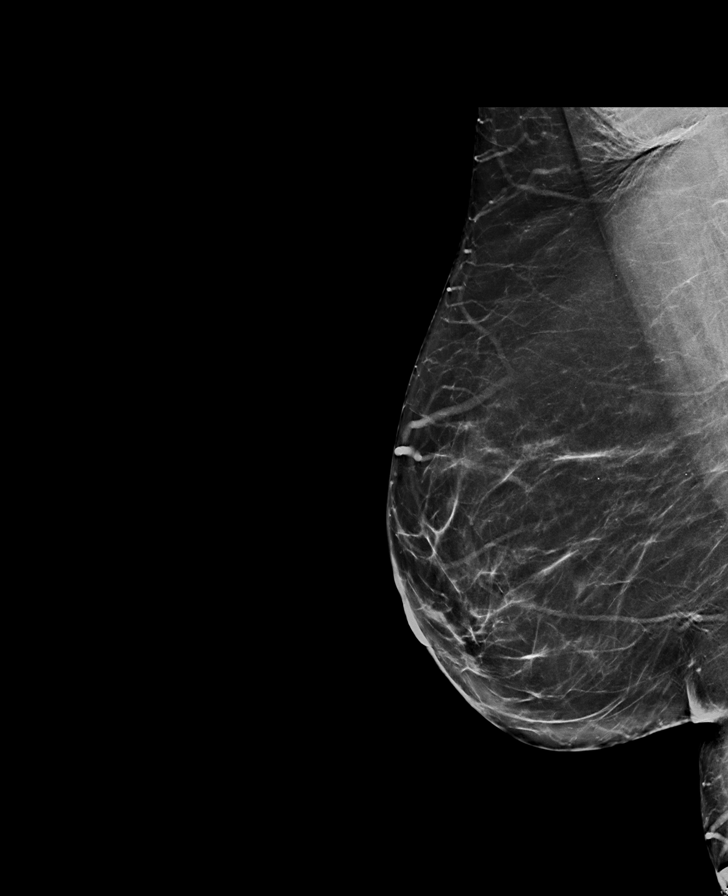

[L CC synth-2D]
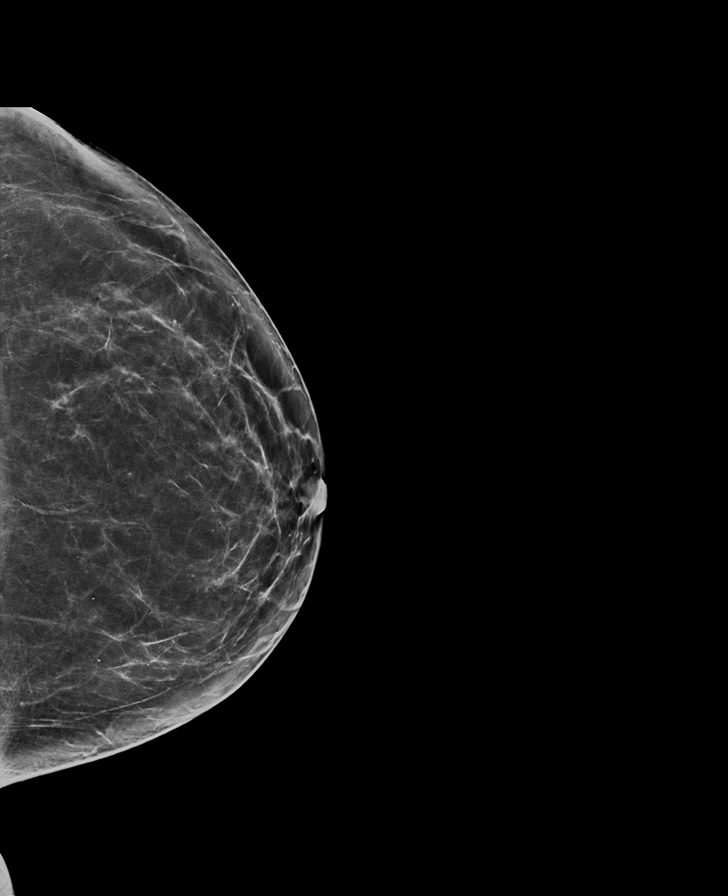

[L CC tomo · tomo slice 38/75.0]
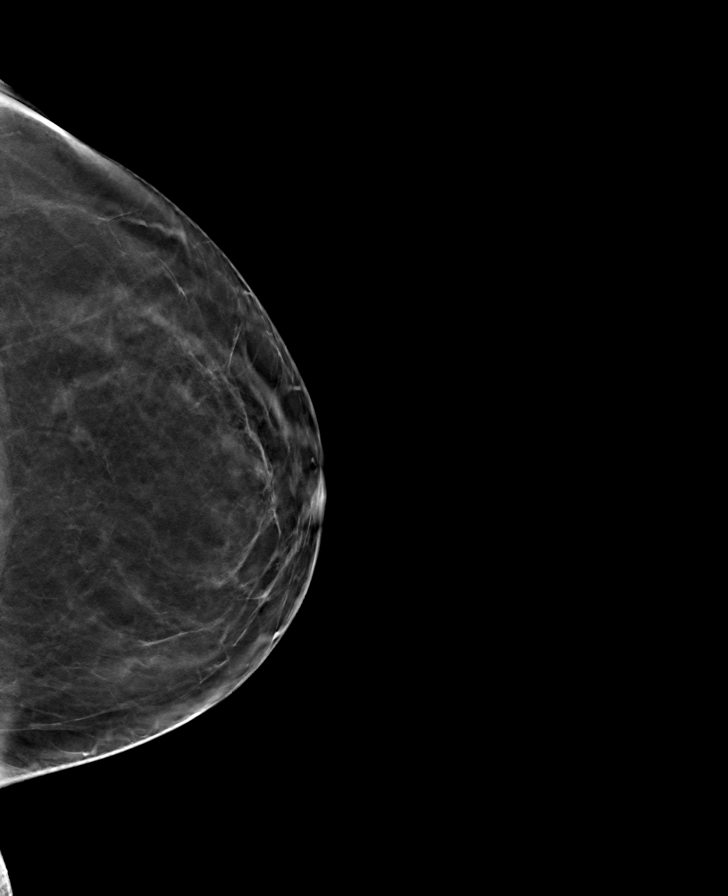

[R MLO tomo · tomo slice 38/75.0]
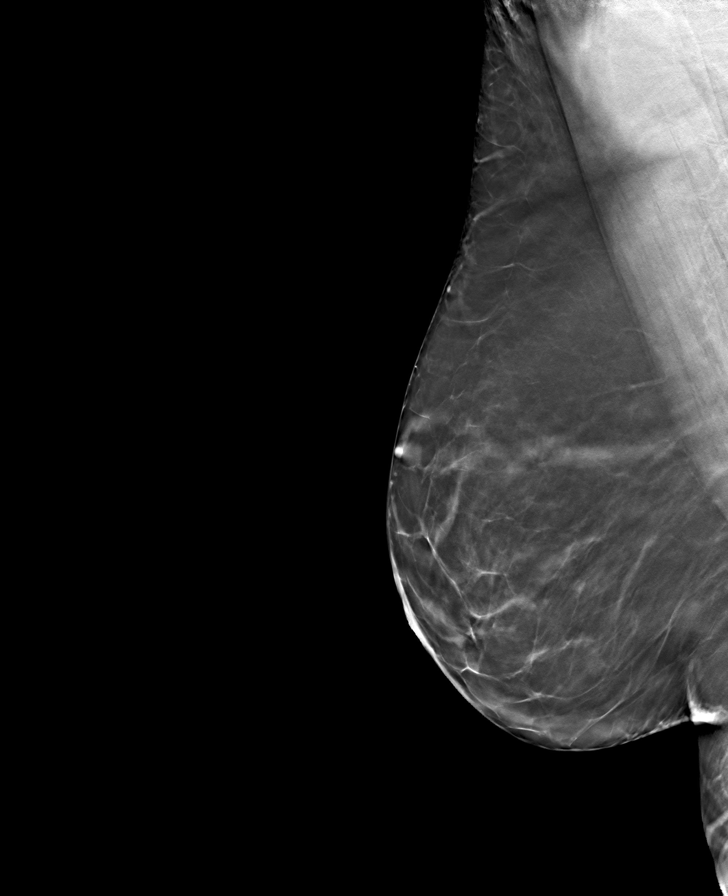

[L MLO tomo · tomo slice 38/75.0]
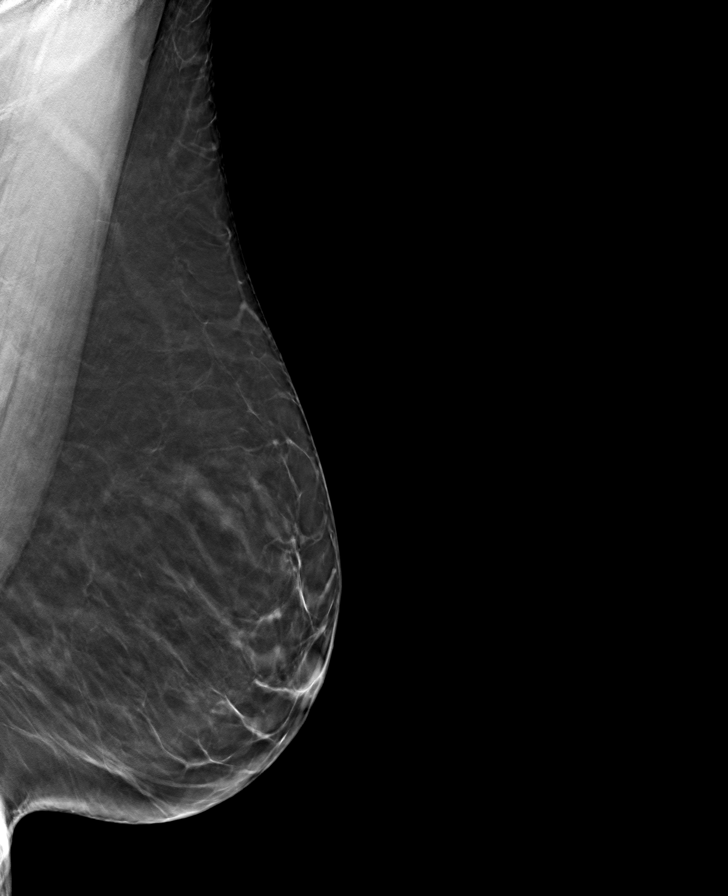

[R CC tomo · tomo slice 35/68.0]
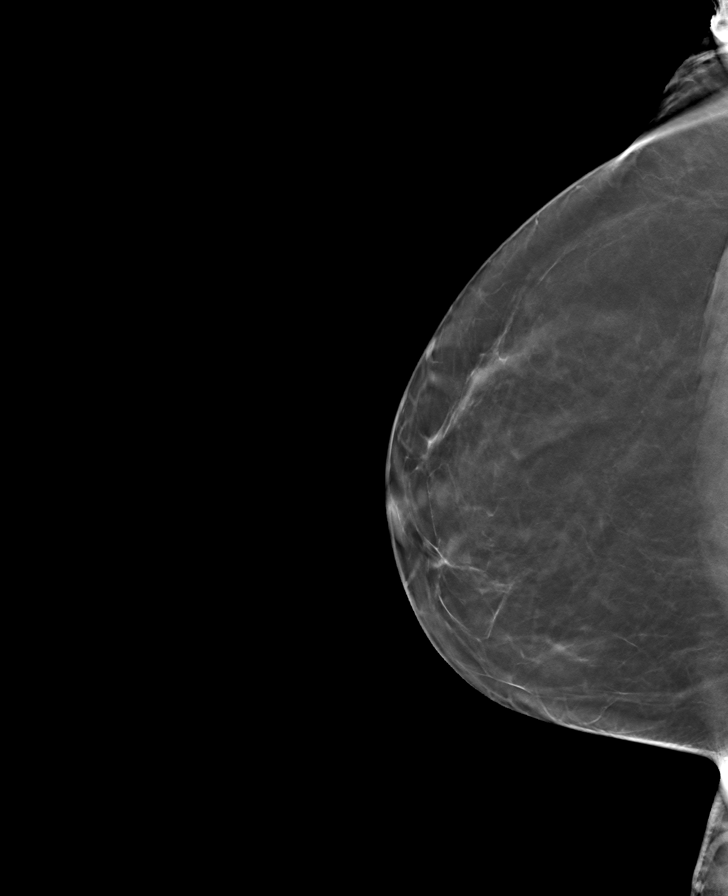

[8 of 24 positions shown; findings below may reference images not displayed]

ACR Breast Density Category b: There are scattered areas of
fibroglandular density.
FINDINGS: There are no findings suspicious for malignancy.
IMPRESSION: No mammographic evidence of malignancy. A result letter of this
screening mammogram will be mailed directly to the patient.

RECOMMENDATION:
Screening mammogram in one year. (Code:51-O-LD2)

BI-RADS CATEGORY  1: Negative.

## 2022-09-10 ENCOUNTER — Other Ambulatory Visit (HOSPITAL_COMMUNITY): Payer: Self-pay | Admitting: Family Medicine

## 2022-09-10 DIAGNOSIS — Z1231 Encounter for screening mammogram for malignant neoplasm of breast: Secondary | ICD-10-CM

## 2022-10-19 ENCOUNTER — Ambulatory Visit (HOSPITAL_COMMUNITY)
Admission: RE | Admit: 2022-10-19 | Discharge: 2022-10-19 | Disposition: A | Discharge status: Home / Self Care | Payer: BC Managed Care – PPO | Source: Ambulatory Visit | Attending: Family Medicine | Admitting: Family Medicine

## 2022-10-19 DIAGNOSIS — Z1231 Encounter for screening mammogram for malignant neoplasm of breast: Secondary | ICD-10-CM | POA: Diagnosis not present

## 2023-09-20 ENCOUNTER — Other Ambulatory Visit (HOSPITAL_COMMUNITY): Payer: Self-pay | Admitting: Family Medicine

## 2023-09-20 DIAGNOSIS — Z1231 Encounter for screening mammogram for malignant neoplasm of breast: Secondary | ICD-10-CM

## 2023-10-25 ENCOUNTER — Ambulatory Visit (HOSPITAL_COMMUNITY)
Admission: RE | Admit: 2023-10-25 | Discharge: 2023-10-25 | Disposition: A | Discharge status: Home / Self Care | Payer: BC Managed Care – PPO | Source: Ambulatory Visit | Attending: Family Medicine | Admitting: Family Medicine

## 2023-10-25 DIAGNOSIS — Z1231 Encounter for screening mammogram for malignant neoplasm of breast: Secondary | ICD-10-CM | POA: Diagnosis present

## 2024-01-04 ENCOUNTER — Ambulatory Visit (INDEPENDENT_AMBULATORY_CARE_PROVIDER_SITE_OTHER): Payer: BC Managed Care – PPO | Admitting: Endocrinology

## 2024-01-04 ENCOUNTER — Encounter: Payer: Self-pay | Admitting: Endocrinology

## 2024-01-04 VITALS — BP 130/90 | HR 75 | Resp 20 | Ht 65.0 in | Wt 206.8 lb

## 2024-01-04 DIAGNOSIS — E038 Other specified hypothyroidism: Secondary | ICD-10-CM

## 2024-01-04 DIAGNOSIS — E042 Nontoxic multinodular goiter: Secondary | ICD-10-CM | POA: Diagnosis not present

## 2024-01-04 NOTE — Progress Notes (Signed)
 Outpatient Endocrinology Note [place] Oran Dillenburg, MD   Patient's Name: Kimberly Vance    DOB: May 22, 1971    MRN: [id number]  REASON OF VISIT: New consult for thyroid nodules  PCP: Lazaro Arms, MD  REFERRING PROVIDER: Kris Mouton, PA-C     HISTORY OF PRESENT ILLNESS:   Kimberly Vance is a 53 y.o. old female with past medical history as listed below is presented for new consult for thyroid nodules.  Pertinent Thyroid History:  Patient had ultrasound thyroid on October 05, 2023, showed 2 left thyroid nodules and referred to endocrinology for further evaluation and management.  Patient reports she is known to have thyroid nodule, around 2010 and had a biopsy at that time, with benign cytology and possibly Hashimoto thyroiditis, no records available to review and patient does not recall details.  Ultrasound thyroid in October 05, 2023, reviewed report is scanned into media: Right thyroid lobe measuring 1.8 x 4.4 x 1.7 cm, left thyroid lobe measuring 1.7 x 4.8 x 1.7 cm, isthmus 0.7 cm. Thyroid parenchyma heterogenous bilaterally.  There was 1.9 x 1.3 x 1.3 cm mostly solid oval or round isoechoic nodule with calcification in the left thyroid lobe.  There was 1.9 x 1.4 x 1.3 cm mostly solid oval or round isoechoic nodule in the left thyroid.  -Patient has no neck compressive symptoms.  She denies palpitation, heat intolerance, cold intolerance.  She has gained weight mildly.  She has never been on thyroid medication.  Thyroid lab in September 23, 2023 TSH 4.440 mildly elevated and normal free T4 of 0.98.  Patient has family history of hypothyroidism in mother.  No family history of thyroid cancer.  No radiation exposure to head and neck.  REVIEW OF SYSTEMS:  As per history of present illness.   PAST MEDICAL HISTORY: Past Medical History:  Diagnosis Date   Asthma    Complication of anesthesia    Woke up unaware of where she was   Seasonal allergies    Tick bite     PAST SURGICAL  HISTORY: Past Surgical History:  Procedure Laterality Date   CHOLECYSTECTOMY     SHOULDER ARTHROSCOPY Right 09/09/15   Reattached Rotatar tendon and a ligament   SHOULDER ARTHROSCOPY Right 12/30/2015   Procedure: RIGHT SHOULDER EXAM UNDER ANESTHESIA MANIPULATION UNDER ANESTHESIA  ARTHROSCOPIC CAPSULAR RELEASE AND SCAR RELEASE ;  Surgeon: Beverely Low, MD;  Location: [facility] OR;  Service: Orthopedics;  Laterality: Right;   SHOULDER SURGERY Right 08/2016   TONSILLECTOMY      ALLERGIES: Allergies  Allergen Reactions   Other Anaphylaxis    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     Allegra [Fexofenadine] Rash   Erythromycin Itching    FAMILY HISTORY:  Family History  Problem Relation Age of Onset   Hypertension Mother    Other Mother        hypogylcemia   Diabetes Father    Hypertension Father    Asthma Father    Diabetes Maternal Grandmother    Diabetes Paternal Grandmother    Healthy Son    Allergic rhinitis Neg Hx    Angioedema Neg Hx    Eczema Neg Hx    Immunodeficiency Neg Hx    Urticaria Neg Hx     SOCIAL HISTORY: Social History   Socioeconomic History   Marital status: Single    Spouse  name: Not on file   Number of children: Not on file   Years of education: Not on file   Highest education level: Not on file  Occupational History   Not on file  Tobacco Use   Smoking status: Former    Types: Cigarettes   Smokeless tobacco: Never   Tobacco comments:    1 pack every 2 weeks - none sincwe 08/2015  Vaping Use   Vaping status: Never Used  Substance and Sexual Activity   Alcohol use: Yes    Alcohol/week: 0.0 standard drinks of alcohol    Comment: 1-2x per week/month   Drug use: No   Sexual activity: Yes  Other Topics Concern   Not on file  Social History Narrative   Lives with  son in one story home.     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     Also works at [employer].     Social Drivers of Corporate investment banker Strain: Not on file  Food Insecurity: Not on file  Transportation Needs: Not on file  Physical Activity: Not on file  Stress: Not on file  Social Connections: Not on file    MEDICATIONS:  Current Outpatient Medications  Medication Sig Dispense Refill   albuterol (VENTOLIN HFA) 108 (90 Base) MCG/ACT inhaler Inhale 1-2 puffs into the lungs every 4 (four) hours as needed for wheezing or shortness of breath. 18 g 1   AUVI-Q 0.3 MG/0.3ML SOAJ injection Inject 0.3 mg into the muscle as needed for anaphylaxis. 2 each 1   beclomethasone (QVAR REDIHALER) 80 MCG/ACT inhaler 2 puffs twice daily to prevent coughing or wheezing. 10.6 g 5   cetirizine (ZYRTEC) 10 MG tablet Take 1 tablet (10 mg total) by mouth daily. 30 tablet 5   montelukast (SINGULAIR) 10 MG tablet Take 1 tablet (10 mg total) by mouth at bedtime as needed. 30 tablet 5   Crisaborole (EUCRISA) 2 % OINT Apply 1 application topically 2 (two) times daily. (Patient not taking: Reported on 01/04/2024) 60 g 5   doxycycline (MONODOX) 100 MG capsule Take 1 capsule (100 mg total) by mouth 2 (two) times daily. (Patient not taking: Reported on 01/04/2024) 14 capsule 0   No current facility-administered medications for this visit.    PHYSICAL EXAM: Vitals:   01/04/24 1304 01/04/24 1305  BP: (!) 130/90 (!) 130/90  Pulse: 75   Resp: 20   SpO2: 96%   Weight: 206 lb 12.8 oz (93.8 kg)   Height: 5\' 5"  (1.651 m)    Body mass index is 34.41 kg/m.  Wt Readings from Last 3 Encounters:  01/04/24 206 lb 12.8 oz (93.8 kg)  08/28/20 165 lb (74.8 kg)  06/29/19 161 lb 3.2 oz (73.1 kg)     General: Well developed, well nourished female in no apparent distress.  HEENT: AT/[place], no external lesions. Hearing  intact to the spoken word Eyes: EOMI. No stare, proptosis. Conjunctiva clear and no icterus. Neck: Trachea midline, neck supple without appreciable thyromegaly or lymphadenopathy and left palpable thyroid nodule + Lungs: Clear to auscultation, no wheeze. Respirations not labored Heart: S1S2, Regular in rate and rhythm. No loud murmurs Abdomen: Soft, non tender, non distended Neurologic: Alert, oriented, normal speech, deep tendon biceps reflexes 1+,  no gross focal neurological deficit Extremities: No pedal pitting edema, no tremors of outstretched hands Skin: Warm, color good. Dry skin. Psychiatric: Does not appear  depressed or anxious  PERTINENT HISTORIC LABORATORY AND IMAGING STUDIES:  All pertinent laboratory results were reviewed. Please see HPI also for further details.   TSH  Date Value Ref Range Status  10/09/2014 2.885 0.350 - 4.500 uIU/mL Final     ASSESSMENT / PLAN  1. Multiple thyroid nodules   2. Subclinical hypothyroidism    -Patient had ultrasound thyroid in December 2024 with 2 left thyroid nodules, 1.9 cm with calcification solid nodule and another nodule 1.9 cm solid isoechoic.  She had thyroid function test in December 2024 with mildly elevated TSH consistent with subclinical hypothyroidism.  Patient is clinically mildly hypothyroid in the clinic today.  She is currently not on thyroid medication. -These 2 nodules meet criteria for FNA based on ATA guideline however no images available to review.  Plan: -Will refer to IR for FNA of 2 left thyroid nodules.  Patient is asked to get ultrasound imaging records in disc and bring it to [facility] imaging or in our clinic. -Will check thyroid function test TSH, free T4. -I would also like to check thyroid autoantibodies for Hashimoto's thyroiditis: thyroid peroxidase antibody and thyroglobulin antibody.  Thyroid nodule / FNA talk: -Approximately 5% of individuals have palpable thyroid nodules and 30% or more of adults  have non-palpable nodules. The prevalence of nodules increases with age, so that perhaps 50% of individuals older than 55 years of age have nodules. Many of these nodules will be well under 1cm in size.  -The incidence of thyroid cancer is low, but rising. The prevalence of thyroid cancer is low compared with the prevalence of thyroid nodule.  -There have been a number of studies that have estimated risk of malignancy in thyroid nodule. The estimated risk varies with size and other characteristics of nodules selected for biopsy, the technique used for the biopsy, the institution and its referral patterns, and the characteristics of the local population. Most studies have estimated malignancy risk in nodules that are palpable or > 1cm on U/S to be in the range of 3-15%.    - In general, options for further evaluation include: - Follow with serial U/S - Fine needle aspiration biopsy - Thyroidectomy - In general the criteria for FNA biopsy includes:  - All palpable nodules - Generally nodules > 1 and for some nodules up to > 1 to 2 cm, based on other criteria.  - Rarely Nodules > 8-33mm in two or more dimensions with high risk sonographic features, or occuring in patients with high risk historical features. Nodules not meeting the above criteria can generally be followed with serial ultrasounds.  -Thyroid FNA is required for further evaluation of nodule to see if suspicious for cancer which may require further surgical treatment. -There are false positive and false negative results with FNA and need for repeat FNA or surgical resection in some cases and also possibility of missing a diagnosis of Cancer in 5-15% of cases. -discussed the risk and benefits of procedure and potential complications such as bleeding, pain in neck, swelling in neck in rare cases due to hematoma formation and causing respiratory compromise, possibility of infection etc. -discussed the need for continued follow up even if the  nodule were found to be benign on FNA.  -discussed that the only near 100% method of ruling out thyroid cancer is by surgical resection. -patient agreeable for Thyroid FNA.   You may read about thyroid nodule from this link of American Thyroid Association.  I provided ATA hands out about thyroid nodules.  [email or web address]  We have reviewed our plan outlined above with the patient verbalizes understanding.    All questions were answered.   Diagnoses and all orders for this visit:  Multiple thyroid nodules -     [place] FNA BX THYROID 1ST LESION AFIRMA; Future -     [place] FNA BIOPSY THYROID EA ADD LESION AFIRMA; Future  Subclinical hypothyroidism -     T4, free -     TSH -     Thyroid peroxidase antibody -     Thyroglobulin antibody    DISPOSITION Follow up in clinic in 2 months suggested.  All questions answered and patient verbalized understanding of the plan.  [place] Bryssa Tones, MD [facility] [address] [place], [address] [place], [place] [address] Phone # [phone]  At least part of this note was generated using voice recognition software. Inadvertent word errors may have occurred, which were not recognized during the proofreading process.

## 2024-01-04 NOTE — Patient Instructions (Addendum)
 Please get ultrasound images records that was done in December 2024 in disc and bring to [facility] imaging center where you are going to have biopsy of thyroid nodule.   Will refer to radiology for biopsy of two  left thyroid nodules.  Will check labs for thyroid.   Levothyroxine Capsules What is this medication? LEVOTHYROXINE (lee voe thye ROX een) treats low thyroid levels (hypothyroidism) in your body. It works by replacing a thyroid hormone normally made by the body. Thyroid hormones play an important role in your overall health. They help support metabolism and energy levels. This medicine may be used for other purposes; ask your health care provider or pharmacist if you have questions. COMMON BRAND NAME(S): TIROSINT What should I tell my care team before I take this medication? They need to know if you have any of these conditions: Addison disease Adrenal gland problem Bone problems Chest pain Diabetes Dieting or on a weight loss program Fertility problems Heart disease Pituitary gland problem Take medications that treat or prevent blood clots An unusual or allergic reaction to levothyroxine, glycerin, glycerol, other medications, foods, dyes, or preservatives Pregnant or trying to get pregnant Breastfeeding How should I use this medication? Take this medication by mouth with a glass of water. It is best to take on an empty stomach, at least 30 minutes to one hour before breakfast. Avoid taking antacids containing aluminum or magnesium, simethicone, bile acid sequestrants, calcium carbonate, sodium polystyrene sulfonate, ferrous sulfate, sevelamer, lanthanum, or sucralfate within 4 hours of taking this medication. Do not cut, crush or chew this medication. Follow the directions on the prescription label. Take at the same time each day. Do not take your medication more often than directed. Talk to your care team regarding the use of this medication in children. While this  medication may be prescribed for selected conditions, precautions do apply. Since the capsules cannot be crushed or placed in water, they may only be given to infants and children who are able to swallow an intact capsule. Overdosage: If you think you have taken too much of this medicine contact a poison control center or emergency room at once. NOTE: This medicine is only for you. Do not share this medicine with others. What if I miss a dose? If you miss a dose, take it as soon as you can. If it is almost time for your next dose, take only that dose. Do not take double or extra doses. What may interact with this medication? Amiodarone Antacids Calcium supplements Carbamazepine Certain medications for depression Certain medications to treat cancer Cholestyramine Clofibrate Colesevelam Colestipol Digoxin Estrogen or progestin hormones Glyoxylate Iron supplements Ketamine Lanthanum Liquid nutrition products, such as Ensure Lithium Medications for colds and breathing difficulties Medications for diabetes Medications or dietary supplements for weight loss Methadone Niacin Orlistat Oxandrolone Phenobarbital or other barbiturates Phenytoin Rifampin Sevelamer Simethicone Sodium polystyrene sulfonate Soy isoflavones Steroid medications, such as prednisone or cortisone Sucralfate Testosterone Theophylline Thyroid hormones Warfarin This list may not describe all possible interactions. Give your health care provider a list of all the medicines, herbs, non-prescription drugs, or dietary supplements you use. Also tell them if you smoke, drink alcohol, or use illegal drugs. Some items may interact with your medicine. What should I watch for while using this medication? Visit your care team for regular checks on your progress. Tell your care team if your symptoms do not start to get better or if they get worse. It may be some time before you see the  benefit from this medication. Do  not switch brands of this medication unless your care team agrees with the change. Ask questions if you are uncertain. You may need blood work done while you are taking this medication. Biotin (vitamin B7) may interfere with your thyroid function test. Stop taking supplements that contain biotin 2 days before your blood work. This medication can affect blood sugar levels. If you have diabetes, check your blood sugar as directed. What side effects may I notice from receiving this medication? Side effects that you should report to your care team as soon as possible: Allergic reactions--skin rash, itching, hives, swelling of the face, lips, tongue, or throat Anxiety, nervousness Excessive sweating or sensitivity to heat Fever Heart palpitations--rapid, pounding, or irregular heartbeat Heart rhythm changes--fast or irregular heartbeat, dizziness, feeling faint or lightheaded, chest pain, trouble breathing Irregular menstrual cycles or spotting Severe diarrhea Tremors or shaking Trouble sleeping Side effects that usually do not require medical attention (report to your care team if they continue or are bothersome): Changes in appetite Hair loss Headache Nausea Vomiting This list may not describe all possible side effects. Call your doctor for medical advice about side effects. You may report side effects to FDA at 1-800-FDA-1088. Where should I keep my medication? Keep out of the reach of children and pets. Store at room temperature between 15 and 30 degrees C (59 and 86 degrees F). Protect from light and moisture. Keep container tightly closed. Throw away any unused medication after the expiration date. NOTE: This sheet is a summary. It may not cover all possible information. If you have questions about this medicine, talk to your doctor, pharmacist, or health care provider.  2024 Elsevier/Gold Standard (2022-12-18 00:00:00)

## 2024-01-06 LAB — T4, FREE: Free T4: 1 ng/dL (ref 0.8–1.8)

## 2024-01-06 LAB — TSH: TSH: 4.95 m[IU]/L — ABNORMAL HIGH

## 2024-01-06 LAB — THYROID PEROXIDASE ANTIBODY: Thyroperoxidase Ab SerPl-aCnc: 756 [IU]/mL — ABNORMAL HIGH (ref <9)

## 2024-01-06 LAB — THYROGLOBULIN ANTIBODY: Thyroglobulin Ab: <1 [IU]/mL

## 2024-01-07 ENCOUNTER — Encounter: Payer: Self-pay | Admitting: Endocrinology

## 2024-01-07 ENCOUNTER — Telehealth: Payer: Self-pay

## 2024-01-07 MED ORDER — LEVOTHYROXINE SODIUM 25 MCG PO TABS: 25.0000 ug | ORAL_TABLET | Freq: Every day | ORAL | 3 refills | Status: DC

## 2024-01-07 NOTE — Addendum Note (Signed)
 Addended by: Kanetra Ho, [place] on: 01/07/2024 08:56 AM   Modules accepted: Orders

## 2024-01-07 NOTE — Telephone Encounter (Signed)
 LMOM for pt to get disc of thyroid US to [facility].

## 2024-01-21 ENCOUNTER — Telehealth: Payer: Self-pay | Admitting: Endocrinology

## 2024-01-21 NOTE — Telephone Encounter (Signed)
 Patient given info to reach [facility] imaging

## 2024-01-21 NOTE — Telephone Encounter (Signed)
 Patient is calling to say that she mailed a disk about her thyroid results to attention Brandy and she is calling to see if it was received.

## 2024-01-26 ENCOUNTER — Telehealth: Payer: Self-pay

## 2024-01-26 DIAGNOSIS — E042 Nontoxic multinodular goiter: Secondary | ICD-10-CM

## 2024-01-26 NOTE — Telephone Encounter (Signed)
 I have placed order for ultrasound thyroid.

## 2024-01-26 NOTE — Addendum Note (Signed)
 Addended by: Monice Lundy, [place] on: 01/26/2024 10:38 PM   Modules accepted: Orders

## 2024-01-26 NOTE — Telephone Encounter (Signed)
 Brandy from [facility] imaging called and state while they do have patient's U/S on a disc they are unable to download in their system so another U/S needed to be done that they can save on file before biopsy can be done.

## 2024-01-27 ENCOUNTER — Encounter: Payer: Self-pay | Admitting: Family

## 2024-01-28 ENCOUNTER — Ambulatory Visit
Admission: RE | Admit: 2024-01-28 | Discharge: 2024-01-28 | Disposition: A | Discharge status: Home / Self Care | Source: Ambulatory Visit | Attending: Endocrinology | Admitting: Endocrinology

## 2024-01-28 DIAGNOSIS — E042 Nontoxic multinodular goiter: Secondary | ICD-10-CM

## 2024-01-31 ENCOUNTER — Telehealth: Payer: Self-pay

## 2024-01-31 ENCOUNTER — Encounter: Payer: Self-pay | Admitting: Endocrinology

## 2024-01-31 NOTE — Telephone Encounter (Signed)
-----   Message from [place] Thapa sent at 01/31/2024 12:20 PM EDT ----- Please notify patient of ultrasound thyroid  reviewed left mid complex thyroid  nodule measuring 2.1 cm and left inferior thyroid  nodule hypoechoic solid measuring 1.6 cm require fine-needle aspiration/biopsy, patient was previously referred to interventional radiology for biopsies/FNAs.  Please ask patient to contact [facility] imaging/radiology to schedule.

## 2024-01-31 NOTE — Telephone Encounter (Signed)
 Patient given results and next steps of treatment as directed by MD. No further questions at this time.

## 2024-02-23 ENCOUNTER — Other Ambulatory Visit (HOSPITAL_COMMUNITY)
Admission: RE | Admit: 2024-02-23 | Discharge: 2024-02-23 | Disposition: A | Discharge status: Home / Self Care | Source: Ambulatory Visit | Attending: Endocrinology | Admitting: Endocrinology

## 2024-02-23 ENCOUNTER — Ambulatory Visit
Admission: RE | Admit: 2024-02-23 | Discharge: 2024-02-23 | Disposition: A | Discharge status: Home / Self Care | Source: Ambulatory Visit | Attending: Endocrinology

## 2024-02-23 DIAGNOSIS — E042 Nontoxic multinodular goiter: Secondary | ICD-10-CM

## 2024-02-25 LAB — CYTOLOGY - NON PAP

## 2024-02-28 ENCOUNTER — Ambulatory Visit: Payer: Self-pay | Admitting: Endocrinology

## 2024-03-09 ENCOUNTER — Ambulatory Visit: Admitting: Endocrinology

## 2024-03-12 LAB — COLOGUARD: COLOGUARD: NEGATIVE

## 2024-06-15 ENCOUNTER — Ambulatory Visit: Admitting: Endocrinology

## 2024-06-15 ENCOUNTER — Encounter: Payer: Self-pay | Admitting: Endocrinology

## 2024-06-15 VITALS — BP 124/80 | HR 74 | Resp 16 | Ht 65.0 in | Wt 204.8 lb

## 2024-06-15 DIAGNOSIS — E038 Other specified hypothyroidism: Secondary | ICD-10-CM | POA: Diagnosis not present

## 2024-06-15 DIAGNOSIS — E042 Nontoxic multinodular goiter: Secondary | ICD-10-CM | POA: Diagnosis not present

## 2024-06-15 NOTE — Progress Notes (Signed)
 Outpatient Endocrinology Note [place] Versie Fleener, MD   Patient's Name: Kimberly Vance    DOB: 09-Dec-1970    MRN: [id number]  REASON OF VISIT: Follow-up for thyroid  nodule and hypothyroidism  PCP: Favero, John Patrick, DO  REFERRING PROVIDER: Maranda Bottcher, PA-C     HISTORY OF PRESENT ILLNESS:   Charlotta Lapaglia is a 53 y.o. old female with past medical history as listed below is presented for new consult for thyroid  nodule and subclinical hypothyroidism due to Hashimoto's thyroiditis.  Pertinent Thyroid  History:  Patient had ultrasound thyroid  on October 05, 2023, showed 2 left thyroid  nodules and referred to endocrinology for further evaluation and management.  Patient reports she is known to have thyroid  nodule, around 2010 and had a biopsy at that time, with benign cytology and possibly Hashimoto thyroiditis, no records available to review and patient does not recall details.  Ultrasound thyroid  in October 05, 2023, reviewed report is scanned into media: No image available to review.  Right thyroid  lobe measuring 1.8 x 4.4 x 1.7 cm, left thyroid  lobe measuring 1.7 x 4.8 x 1.7 cm, isthmus 0.7 cm. Thyroid  parenchyma heterogenous bilaterally.  There was 1.9 x 1.3 x 1.3 cm mostly solid oval or round isoechoic nodule with calcification in the left thyroid  lobe.  There was 1.9 x 1.4 x 1.3 cm mostly solid oval or round isoechoic nodule in the left thyroid .  US  thyroid  in 01/28/2024: Heterogeneous thyroid  parenchyma.  Left mid thyroid  nodule measuring 2.1 cm x 1.4 x 1.6 cm, mixed cystic and solid.  Left inferior thyroid  nodule 1.6 cm x 1.4 x 1.4 cm completely solid hypoechoic.  She underwent FNA of left inferior thyroid  nodule measuring 0.6 cm in Feb 23, 2024 with benign cytology benign follicular nodule Bethesda category 2.  Patient has no neck compressive symptoms.  Patient has family history of hypothyroidism in mother.  No family history of thyroid  cancer.  No radiation exposure to head and  neck.  # Subclinical hypothyroidism secondary to Hashimoto thyroiditis.  She has elevated thyroid  peroxidase antibody.  TSH was mildly elevated 4.95 with normal free T4 consistent with subclinical hypothyroidism, was started on levothyroxine  25 mg daily in March 2025.  Interval history Patient reports she has been taking levothyroxine  50 mcg daily, received new prescription few months ago from PCP.  She takes in the morning before breakfast.  She is overall feeling usual energy.  No cold intolerance.  No neck discomfort or any compressive symptoms.  She had FNA of left inferior thyroid  nodule in May with benign cytology.  No other complaints today.  REVIEW OF SYSTEMS:  As per history of present illness.   PAST MEDICAL HISTORY: Past Medical History:  Diagnosis Date   Asthma    Complication of anesthesia    Woke up unaware of where she was   Seasonal allergies    Tick bite     PAST SURGICAL HISTORY: Past Surgical History:  Procedure Laterality Date   CHOLECYSTECTOMY     SHOULDER ARTHROSCOPY Right 09/09/15   Reattached Rotatar tendon and a ligament   SHOULDER ARTHROSCOPY Right 12/30/2015   Procedure: RIGHT SHOULDER EXAM UNDER ANESTHESIA MANIPULATION UNDER ANESTHESIA  ARTHROSCOPIC CAPSULAR RELEASE AND SCAR RELEASE ;  Surgeon: Marcey Her, MD;  Location: [facility] OR;  Service: Orthopedics;  Laterality: Right;   SHOULDER SURGERY Right 08/2016   TONSILLECTOMY      ALLERGIES: Allergies  Allergen Reactions   Other Anaphylaxis    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     Allegra [Fexofenadine] Rash   Erythromycin Itching    FAMILY HISTORY:  Family History  Problem Relation Age of Onset   Hypertension Mother    Other Mother        hypogylcemia   Diabetes Father    Hypertension Father    Asthma Father     Diabetes Maternal Grandmother    Diabetes Paternal Grandmother    Healthy Son    Allergic rhinitis Neg Hx    Angioedema Neg Hx    Eczema Neg Hx    Immunodeficiency Neg Hx    Urticaria Neg Hx     SOCIAL HISTORY: Social History   Socioeconomic History   Marital status: Single    Spouse name: Not on file   Number of children: Not on file   Years of education: Not on file   Highest education level: Not on file  Occupational History   Not on file  Tobacco Use   Smoking status: Former    Types: Cigarettes   Smokeless tobacco: Never   Tobacco comments:    1 pack every 2 weeks - none sincwe 08/2015  Vaping Use   Vaping status: Never Used  Substance and Sexual Activity   Alcohol use: Yes    Alcohol/week: 0.0 standard drinks of alcohol    Comment: 1-2x per week/month   Drug use: No   Sexual activity: Yes  Other Topics Concern   Not on file  Social History Narrative   Lives with son in one story home.     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     Also works at [employer].     Social Drivers of Corporate investment banker Strain: Not on file  Food Insecurity: Not on file  Transportation Needs: Not on file  Physical Activity: Not on file  Stress: Not on file  Social Connections: Not on file    MEDICATIONS:  Current Outpatient Medications  Medication Sig Dispense Refill   albuterol  (VENTOLIN  HFA) 108 (90 Base) MCG/ACT inhaler Inhale 1-2 puffs into the lungs every 4 (four) hours as needed for wheezing or shortness of breath. 18 g 1   AUVI-Q  0.3 MG/0.3ML SOAJ injection Inject 0.3 mg into the muscle as needed for anaphylaxis. 2 each 1   beclomethasone (QVAR  REDIHALER) 80 MCG/ACT inhaler 2 puffs twice daily to prevent coughing or wheezing. 10.6 g 5   cetirizine  (ZYRTEC ) 10 MG tablet Take 1 tablet (10 mg total) by mouth daily. 30 tablet 5   Crisaborole  (EUCRISA ) 2 % OINT  Apply 1 application topically 2 (two) times daily. 60 g 5   doxycycline  (MONODOX ) 100 MG capsule Take 1 capsule (100 mg total) by mouth 2 (two) times daily. 14 capsule 0   levothyroxine  (SYNTHROID ) 25 MCG tablet Take 1 tablet (25 mcg total) by mouth daily. (Patient taking differently: Take 50 mcg by mouth daily. Increased by MD per patient) 90 tablet 3   montelukast  (SINGULAIR ) 10 MG tablet Take 1 tablet (10 mg total) by mouth at bedtime as needed. 30 tablet 5   No current facility-administered medications for this visit.    PHYSICAL EXAM: Vitals:   06/15/24 1419  BP: 124/80  Pulse: 74  Resp: 16  SpO2: 97%  Weight: 204 lb 12.8 oz (92.9  kg)  Height: 5' 5 (1.651 m)   Body mass index is 34.08 kg/m.  Wt Readings from Last 3 Encounters:  06/15/24 204 lb 12.8 oz (92.9 kg)  01/04/24 206 lb 12.8 oz (93.8 kg)  08/28/20 165 lb (74.8 kg)     General: Well developed, well nourished female in no apparent distress.  HEENT: AT/[place], no external lesions. Hearing intact to the spoken word Eyes: EOMI. No stare, proptosis. Conjunctiva clear and no icterus. Neck: Trachea midline, neck supple without appreciable thyromegaly or lymphadenopathy and left palpable thyroid  nodule + Lungs: Clear to auscultation, no wheeze. Respirations not labored Heart: S1S2, Regular in rate and rhythm. No loud murmurs Abdomen: Soft, non tender, non distended Neurologic: Alert, oriented, normal speech, deep tendon biceps reflexes 2+,  no gross focal neurological deficit Extremities: No pedal pitting edema, no tremors of outstretched hands Skin: Warm, color good. Psychiatric: Does not appear depressed or anxious  PERTINENT HISTORIC LABORATORY AND IMAGING STUDIES:  All pertinent laboratory results were reviewed. Please see HPI also for further details.   TSH  Date Value Ref Range Status  01/04/2024 4.95 (H) mIU/L Final    Comment:              Reference Range .           > or = 20 Years  0.40-4.50 .                 Pregnancy Ranges           First trimester    0.26-2.66           Second trimester   0.55-2.73           Third trimester    0.43-2.91   10/09/2014 2.885 0.350 - 4.500 uIU/mL Final     ASSESSMENT / PLAN  1. Subclinical hypothyroidism   2. Multiple thyroid  nodules     -Patient had ultrasound thyroid  in December 2024 with 2 left thyroid  nodules, 1.9 cm with calcification solid nodule and another nodule 1.9 cm solid isoechoic.  Ultrasound was repeated in April 2025 showed left mid thyroid  nodule complex measuring 2.1 cm and left inferior thyroid  nodule solid hypoechoic measuring 1.6 cm status post FNA of left inferior thyroid  nodule with benign cytology.  - She has subclinical hypothyroidism with mildly elevated TSH with hyperthyroid symptoms, started levothyroxine  in March 2025.  She is currently taking levothyroxine  50 mcg daily.  She has hypothyroidism secondary to Hashimoto's thyroiditis.  Plan: - Will check thyroid  function test today.  She would also like to check thyroid  peroxidase antibody.  Discussed that monitoring thyroid  peroxidase does not help with treatment plan. - Will adjust her dose of levothyroxine  based on lab results from today. - Will monitor thyroid  nodules with serial ultrasound, will plan for repeat ultrasound around summer 2026.  Diagnoses and all orders for this visit:  Subclinical hypothyroidism -     T4, free -     TSH -     Thyroid  peroxidase antibody  Multiple thyroid  nodules   DISPOSITION Follow up in clinic in 4-5 months suggested.  All questions answered and patient verbalized understanding of the plan.  [place] Talha Iser, MD [facility] Endocrinology [facility] [address] [place], [address] [place], [place] [address] Phone # [phone]  At least part of this note was generated using voice recognition software. Inadvertent word errors may have occurred, which were not recognized during the proofreading process.

## 2024-06-16 ENCOUNTER — Ambulatory Visit: Payer: Self-pay | Admitting: Endocrinology

## 2024-06-16 DIAGNOSIS — E038 Other specified hypothyroidism: Secondary | ICD-10-CM

## 2024-06-16 MED ORDER — LEVOTHYROXINE SODIUM 50 MCG PO TABS: 50.0000 ug | ORAL_TABLET | Freq: Every day | ORAL | 3 refills | Status: AC

## 2024-06-19 LAB — T4, FREE: Free T4: 1.2 ng/dL (ref 0.8–1.8)

## 2024-06-19 LAB — TSH: TSH: 3.02 m[IU]/L

## 2024-06-19 LAB — THYROID PEROXIDASE ANTIBODY: Thyroperoxidase Ab SerPl-aCnc: 475 [IU]/mL — ABNORMAL HIGH (ref <9)

## 2024-10-09 ENCOUNTER — Other Ambulatory Visit (HOSPITAL_COMMUNITY): Payer: Self-pay | Admitting: Family Medicine

## 2024-10-09 DIAGNOSIS — Z1231 Encounter for screening mammogram for malignant neoplasm of breast: Secondary | ICD-10-CM

## 2024-10-30 ENCOUNTER — Ambulatory Visit (HOSPITAL_COMMUNITY)

## 2024-11-02 ENCOUNTER — Other Ambulatory Visit: Payer: Self-pay | Admitting: Medical Genetics

## 2024-11-03 ENCOUNTER — Ambulatory Visit: Admitting: Obstetrics & Gynecology

## 2024-11-03 ENCOUNTER — Other Ambulatory Visit (HOSPITAL_COMMUNITY)
Admission: RE | Admit: 2024-11-03 | Discharge: 2024-11-03 | Disposition: A | Source: Ambulatory Visit | Attending: Oncology | Admitting: Oncology

## 2024-11-03 ENCOUNTER — Encounter: Payer: Self-pay | Admitting: Obstetrics & Gynecology

## 2024-11-03 ENCOUNTER — Other Ambulatory Visit (HOSPITAL_COMMUNITY)
Admission: RE | Admit: 2024-11-03 | Discharge: 2024-11-03 | Disposition: A | Source: Ambulatory Visit | Attending: Obstetrics & Gynecology | Admitting: Obstetrics & Gynecology

## 2024-11-03 ENCOUNTER — Ambulatory Visit (HOSPITAL_COMMUNITY)
Admission: RE | Admit: 2024-11-03 | Discharge: 2024-11-03 | Disposition: A | Source: Ambulatory Visit | Attending: Family Medicine | Admitting: Family Medicine

## 2024-11-03 VITALS — BP 133/86 | HR 71 | Ht 66.0 in | Wt 208.8 lb

## 2024-11-03 DIAGNOSIS — Z1231 Encounter for screening mammogram for malignant neoplasm of breast: Secondary | ICD-10-CM | POA: Diagnosis present

## 2024-11-03 DIAGNOSIS — Z124 Encounter for screening for malignant neoplasm of cervix: Secondary | ICD-10-CM | POA: Diagnosis present

## 2024-11-03 DIAGNOSIS — E063 Autoimmune thyroiditis: Secondary | ICD-10-CM

## 2024-11-03 DIAGNOSIS — Z01419 Encounter for gynecological examination (general) (routine) without abnormal findings: Secondary | ICD-10-CM

## 2024-11-03 NOTE — Addendum Note (Signed)
 Addended by: ILEAN RUTHERFORD HERO on: 11/03/2024 10:09 AM   Modules accepted: Orders

## 2024-11-03 NOTE — Progress Notes (Signed)
 Subjective:     Kimberly Vance is a 54 y.o. female here for a routine exam.  No LMP recorded. Patient is postmenopausal. G1P1001 Birth Control Method:  post menopausal Menstrual Calendar(currently): amenorrhea  Current complaints: none.   Current acute medical issues:  none   Recent Gynecologic History No LMP recorded. Patient is postmenopausal. Last Pap: 2018,  normal Last mammogram: ,07/2024  normal  Past Medical History:  Diagnosis Date   Asthma    Complication of anesthesia    Woke up unaware of where she was   Hashimoto's disease    Hypothyroid    Seasonal allergies    Tick bite     Past Surgical History:  Procedure Laterality Date   CHOLECYSTECTOMY     SHOULDER ARTHROSCOPY Right 09/09/15   Reattached Rotatar tendon and a ligament   SHOULDER ARTHROSCOPY Right 12/30/2015   Procedure: RIGHT SHOULDER EXAM UNDER ANESTHESIA MANIPULATION UNDER ANESTHESIA  ARTHROSCOPIC CAPSULAR RELEASE AND SCAR RELEASE ;  Surgeon: Marcey Her, MD;  Location: [facility] OR;  Service: Orthopedics;  Laterality: Right;   SHOULDER SURGERY Right 08/2016   TONSILLECTOMY      OB History     Gravida  1   Para  1   Term  1   Preterm      AB      Living  1      SAB      IAB      Ectopic      Multiple      Live Births              Social History   Socioeconomic History   Marital status: Single    Spouse name: Not on file   Number of children: Not on file   Years of education: Not on file   Highest education level: Not on file  Occupational History   Not on file  Tobacco Use   Smoking status: Former    Types: Cigarettes   Smokeless tobacco: Never   Tobacco comments:    1 pack every 2 weeks - none sincwe 08/2015  Vaping Use   Vaping status: Never Used  Substance and Sexual Activity   Alcohol use: Yes    Alcohol/week: 0.0 standard drinks of alcohol    Comment: 1-2x per week/month   Drug use: No   Sexual activity: Not Currently    Birth control/protection: None,  Post-menopausal  Other Topics Concern   Not on file  Social History Narrative   Lives with son in one story home.     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     Also works at [employer].     Social Drivers of Health   Tobacco Use: Medium Risk (11/03/2024)   Patient History    Smoking Tobacco Use: Former    Smokeless Tobacco Use: Never    Passive Exposure: Not on file  Financial Resource Strain: Low Risk (11/03/2024)   Overall Financial Resource Strain (CARDIA)    Difficulty of Paying Living Expenses: Not very hard  Food Insecurity: No Food Insecurity (11/03/2024)   Epic    Worried About Programme Researcher, Broadcasting/film/video in the Last Year: Never true    Ran Out of Food in the Last Year: Never true  Transportation Needs: No Transportation Needs (11/03/2024)   Epic    Lack of Transportation (Medical): No    Lack of Transportation (Non-Medical): No  Physical Activity: Insufficiently Active (11/03/2024)   Exercise Vital Sign    Days of Exercise per Week: 1 day    Minutes of Exercise per Session: 100 min  Stress: No Stress Concern Present (11/03/2024)   [employer] of Occupational Health - Occupational Stress Questionnaire    Feeling of Stress: Only a little  Social Connections: Socially Isolated (11/03/2024)   Social Connection and Isolation Panel    Frequency of Communication with Friends and Family: Never    Frequency of Social Gatherings with Friends and Family: Never    Attends Religious Services: Never    Database Administrator or Organizations: No    Attends Banker Meetings: Never    Marital Status: Never married  Depression (PHQ2-9): Low Risk (11/03/2024)   Depression (PHQ2-9)    PHQ-2 Score: 3  Alcohol Screen: Low Risk (11/03/2024)   Alcohol Screen    Last Alcohol Screening Score (AUDIT): 1  Housing: Unknown (11/03/2024)   Epic    Unable to Pay for Housing in the Last  Year: No    Number of Times Moved in the Last Year: Not on file    Homeless in the Last Year: No  Utilities: Not At Risk (11/03/2024)   Epic    Threatened with loss of utilities: No  Health Literacy: Adequate Health Literacy (11/03/2024)   B1300 Health Literacy    Frequency of need for help with medical instructions: Never    Family History  Problem Relation Age of Onset   Diabetes Paternal Grandmother    Diabetes Maternal Grandmother    Diabetes Father    Hypertension Father    Asthma Father    Hypertension Mother    Other Mother        hypogylcemia   Breast cancer Mother    Uterine cancer Mother    Healthy Son    Allergic rhinitis Neg Hx    Angioedema Neg Hx    Eczema Neg Hx    Immunodeficiency Neg Hx    Urticaria Neg Hx     Current Medications[1]  Review of Systems  Review of Systems  Constitutional: Negative for fever, chills, weight loss, malaise/fatigue and diaphoresis.  HENT: Negative for hearing loss, ear pain, nosebleeds, congestion, sore throat, neck pain, tinnitus and ear discharge.   Eyes: Negative for blurred vision, double vision, photophobia, pain, discharge and redness.  Respiratory: Negative for cough, hemoptysis, sputum production, shortness of breath, wheezing and stridor.   Cardiovascular: Negative for chest pain, palpitations, orthopnea, claudication, leg swelling and PND.  Gastrointestinal: negative for abdominal pain. Negative for heartburn, nausea, vomiting, diarrhea, constipation, blood in stool and melena.  Genitourinary: Negative for dysuria, urgency, frequency, hematuria and flank pain.  Musculoskeletal: Negative for myalgias, back pain, joint pain and falls.  Skin: Negative for itching and rash.  Neurological: Negative for dizziness, tingling, tremors, sensory change, speech change, focal weakness, seizures, loss of consciousness, weakness and headaches.  Endo/Heme/Allergies: Negative for environmental allergies and polydipsia. Does not  bruise/bleed easily.  Psychiatric/Behavioral: Negative for depression, suicidal ideas, hallucinations, memory loss and substance abuse. The patient is not nervous/anxious and does not have insomnia.        Objective:  Blood pressure 133/86, pulse 71, height 5' 6 (1.676 m), weight 208 lb 12.8 oz (94.7 kg).   Physical Exam  Vitals reviewed. Constitutional: She is oriented to person, place, and time. She appears well-developed and well-nourished.  HENT:  Head: Normocephalic and atraumatic.        Right Ear:  External ear normal.  Left Ear: External ear normal.  Nose: Nose normal.  Mouth/Throat: Oropharynx is clear and moist.  Eyes: Conjunctivae and EOM are normal. Pupils are equal, round, and reactive to light. Right eye exhibits no discharge. Left eye exhibits no discharge. No scleral icterus.  Neck: Normal range of motion. Neck supple. No tracheal deviation present. No thyromegaly present.  Cardiovascular: Normal rate, regular rhythm, normal heart sounds and intact distal pulses.  Exam reveals no gallop and no friction rub.   No murmur heard. Respiratory: Effort normal and breath sounds normal. No respiratory distress. She has no wheezes. She has no rales. She exhibits no tenderness.  GI: Soft. Bowel sounds are normal. She exhibits no distension and no mass. There is no tenderness. There is no rebound and no guarding.  Genitourinary:  Breasts no masses skin changes or nipple changes bilaterally      Vulva is normal without lesions Vagina is pink moist without discharge Cervix normal in appearance and pap is done Uterus is normal size shape and contour Adnexa is negative with normal sized ovaries   Musculoskeletal: Normal range of motion. She exhibits no edema and no tenderness.  Neurological: She is alert and oriented to person, place, and time. She has normal reflexes. She displays normal reflexes. No cranial nerve deficit. She exhibits normal muscle tone. Coordination normal.  Skin:  Skin is warm and dry. No rash noted. No erythema. No pallor.  Psychiatric: She has a normal mood and affect. Her behavior is normal. Judgment and thought content normal.       Medications Ordered at today's visit: No orders of the defined types were placed in this encounter.   Other orders placed at today's visit: No orders of the defined types were placed in this encounter.    ASSESSMENT + PLAN:    ICD-10-CM   1. Well woman exam with routine gynecological exam  Z01.419     2. Encounter for Papanicolaou smear of cervix with human papilloma virus (HPV) DNA cotesting  Z12.4     3. Hashimoto's thyroiditis, now hypothyroid: discussed levothyroxine  supplementation  E06.3           Return in about 3 years (around 11/04/2027) for well woman gyn exam.     [1]  Current Outpatient Medications:    albuterol  (VENTOLIN  HFA) 108 (90 Base) MCG/ACT inhaler, Inhale 1-2 puffs into the lungs every 4 (four) hours as needed for wheezing or shortness of breath., Disp: 18 g, Rfl: 1   AUVI-Q  0.3 MG/0.3ML SOAJ injection, Inject 0.3 mg into the muscle as needed for anaphylaxis., Disp: 2 each, Rfl: 1   Cholecalciferol (VITAMIN D3) 1.25 MG (50000 UT) CAPS, Take 1 capsule by mouth once a week., Disp: , Rfl:    cyanocobalamin (VITAMIN B12) 1000 MCG tablet, Take 1,000 mcg by mouth daily., Disp: , Rfl:    montelukast  (SINGULAIR ) 10 MG tablet, Take 1 tablet (10 mg total) by mouth at bedtime as needed., Disp: 30 tablet, Rfl: 5   selenium 50 MCG TABS tablet, Take 50 mcg by mouth daily. (Patient taking differently: Take 200 mcg by mouth daily.), Disp: , Rfl:    beclomethasone (QVAR  REDIHALER) 80 MCG/ACT inhaler, 2 puffs twice daily to prevent coughing or wheezing. (Patient not taking: Reported on 11/03/2024), Disp: 10.6 g, Rfl: 5   cetirizine  (ZYRTEC ) 10 MG tablet, Take 1 tablet (10 mg total) by mouth daily. (Patient not taking: Reported on 11/03/2024), Disp: 30 tablet, Rfl: 5   levothyroxine  (SYNTHROID ) 50 MCG  tablet, Take 1 tablet (50 mcg total) by mouth daily. (Patient not taking: Reported on 11/03/2024), Disp: 90 tablet, Rfl: 3

## 2024-11-07 LAB — CYTOLOGY - PAP
Comment: NEGATIVE
Diagnosis: NEGATIVE
High risk HPV: NEGATIVE

## 2024-11-12 LAB — GENECONNECT MOLECULAR SCREEN: Genetic Analysis Overall Interpretation: NEGATIVE

## 2024-11-15 ENCOUNTER — Ambulatory Visit: Admitting: Endocrinology
